# Patient Record
Sex: Female | Born: 1965 | Race: White | Hispanic: No | Marital: Married | Smoking: Never smoker
Health system: Southern US, Community
[De-identification: ages and names within clinical notes are randomized; demographics above are authoritative.]

## PROBLEM LIST (undated history)

## (undated) DIAGNOSIS — Z973 Presence of spectacles and contact lenses: Secondary | ICD-10-CM

## (undated) DIAGNOSIS — Z8709 Personal history of other diseases of the respiratory system: Secondary | ICD-10-CM

## (undated) DIAGNOSIS — I4891 Unspecified atrial fibrillation: Secondary | ICD-10-CM

## (undated) DIAGNOSIS — M79673 Pain in unspecified foot: Secondary | ICD-10-CM

## (undated) DIAGNOSIS — Z889 Allergy status to unspecified drugs, medicaments and biological substances status: Secondary | ICD-10-CM

## (undated) DIAGNOSIS — R05 Cough: Secondary | ICD-10-CM

## (undated) DIAGNOSIS — F32A Depression, unspecified: Secondary | ICD-10-CM

## (undated) DIAGNOSIS — Z9889 Other specified postprocedural states: Secondary | ICD-10-CM

## (undated) DIAGNOSIS — N959 Unspecified menopausal and perimenopausal disorder: Secondary | ICD-10-CM

## (undated) DIAGNOSIS — G4730 Sleep apnea, unspecified: Secondary | ICD-10-CM

## (undated) DIAGNOSIS — R5383 Other fatigue: Secondary | ICD-10-CM

## (undated) DIAGNOSIS — R5381 Other malaise: Secondary | ICD-10-CM

## (undated) DIAGNOSIS — Z8489 Family history of other specified conditions: Secondary | ICD-10-CM

## (undated) DIAGNOSIS — G4700 Insomnia, unspecified: Secondary | ICD-10-CM

## (undated) DIAGNOSIS — Z87440 Personal history of urinary (tract) infections: Secondary | ICD-10-CM

## (undated) DIAGNOSIS — R112 Nausea with vomiting, unspecified: Secondary | ICD-10-CM

## (undated) DIAGNOSIS — D519 Vitamin B12 deficiency anemia, unspecified: Secondary | ICD-10-CM

## (undated) DIAGNOSIS — K589 Irritable bowel syndrome without diarrhea: Secondary | ICD-10-CM

## (undated) DIAGNOSIS — R059 Cough, unspecified: Secondary | ICD-10-CM

## (undated) DIAGNOSIS — N200 Calculus of kidney: Secondary | ICD-10-CM

## (undated) DIAGNOSIS — F329 Major depressive disorder, single episode, unspecified: Secondary | ICD-10-CM

## (undated) DIAGNOSIS — E739 Lactose intolerance, unspecified: Secondary | ICD-10-CM

## (undated) DIAGNOSIS — G43909 Migraine, unspecified, not intractable, without status migrainosus: Secondary | ICD-10-CM

## (undated) HISTORY — DX: Vitamin B12 deficiency anemia, unspecified: D51.9

## (undated) HISTORY — PX: TONSILLECTOMY: SUR1361

## (undated) HISTORY — PX: EXPLORATORY LAPAROTOMY: SUR591

## (undated) HISTORY — DX: Calculus of kidney: N20.0

## (undated) HISTORY — DX: Personal history of other diseases of the respiratory system: Z87.09

## (undated) HISTORY — DX: Allergy status to unspecified drugs, medicaments and biological substances: Z88.9

## (undated) HISTORY — PX: ABDOMINAL HYSTERECTOMY: SHX81

## (undated) HISTORY — PX: APPENDECTOMY: SHX54

## (undated) HISTORY — DX: Lactose intolerance, unspecified: E73.9

## (undated) HISTORY — DX: Other fatigue: R53.83

## (undated) HISTORY — DX: Cough, unspecified: R05.9

## (undated) HISTORY — DX: Other malaise: R53.81

## (undated) HISTORY — DX: Cough: R05

## (undated) HISTORY — DX: Unspecified atrial fibrillation: I48.91

## (undated) HISTORY — DX: Irritable bowel syndrome, unspecified: K58.9

## (undated) HISTORY — PX: HAND SURGERY: SHX662

## (undated) HISTORY — DX: Personal history of urinary (tract) infections: Z87.440

## (undated) HISTORY — DX: Migraine, unspecified, not intractable, without status migrainosus: G43.909

## (undated) HISTORY — DX: Sleep apnea, unspecified: G47.30

## (undated) HISTORY — DX: Pain in unspecified foot: M79.673

## (undated) HISTORY — PX: BILATERAL CARPAL TUNNEL RELEASE: SHX6508

## (undated) HISTORY — DX: Insomnia, unspecified: G47.00

## (undated) HISTORY — PX: PARTIAL HYSTERECTOMY: SHX80

## (undated) HISTORY — PX: GASTRIC BYPASS: SHX52

## (undated) HISTORY — DX: Unspecified menopausal and perimenopausal disorder: N95.9

---

## 2005-05-19 ENCOUNTER — Ambulatory Visit: Payer: Self-pay

## 2005-09-10 ENCOUNTER — Other Ambulatory Visit: Payer: Self-pay

## 2005-09-10 ENCOUNTER — Inpatient Hospital Stay: Payer: Self-pay | Admitting: Internal Medicine

## 2005-09-11 ENCOUNTER — Other Ambulatory Visit: Payer: Self-pay

## 2006-01-03 ENCOUNTER — Ambulatory Visit: Payer: Self-pay | Admitting: Oncology

## 2006-01-05 LAB — CBC WITH DIFFERENTIAL (CANCER CENTER ONLY)
BASO#: 0.1 10*3/uL (ref 0.0–0.2)
BASO%: 0.8 % (ref 0.0–2.0)
HCT: 33.2 % — ABNORMAL LOW (ref 34.8–46.6)
HGB: 10.8 g/dL — ABNORMAL LOW (ref 11.6–15.9)
LYMPH#: 2.2 10*3/uL (ref 0.9–3.3)
LYMPH%: 29.7 % (ref 14.0–48.0)
MCHC: 32.6 g/dL (ref 32.0–36.0)
MCV: 76 fL — ABNORMAL LOW (ref 81–101)
MONO#: 0.4 10*3/uL (ref 0.1–0.9)
NEUT%: 59 % (ref 39.6–80.0)
RDW: 14.4 % (ref 10.5–14.6)
WBC: 7.5 10*3/uL (ref 3.9–10.0)

## 2006-01-05 LAB — COMPREHENSIVE METABOLIC PANEL
Albumin: 4.1 g/dL (ref 3.5–5.2)
Alkaline Phosphatase: 115 U/L (ref 39–117)
BUN: 9 mg/dL (ref 6–23)
Glucose, Bld: 82 mg/dL (ref 70–99)
Total Bilirubin: 0.4 mg/dL (ref 0.3–1.2)

## 2006-01-05 LAB — MORPHOLOGY - CHCC SATELLITE

## 2006-01-05 LAB — VITAMIN B12: Vitamin B-12: 250 pg/mL (ref 211–911)

## 2006-01-05 LAB — RETICULOCYTES (CHCC)
ABS Retic: 61.6 10*3/uL (ref 19.0–186.0)
RBC.: 4.4 MIL/uL (ref 3.87–5.11)

## 2006-01-05 LAB — IRON AND TIBC
%SAT: 8 % — ABNORMAL LOW (ref 20–55)
Iron: 37 ug/dL — ABNORMAL LOW (ref 42–145)

## 2006-01-05 LAB — FERRITIN: Ferritin: 10 ng/mL (ref 10–291)

## 2006-03-09 ENCOUNTER — Ambulatory Visit: Payer: Self-pay | Admitting: Oncology

## 2006-03-15 LAB — CBC WITH DIFFERENTIAL (CANCER CENTER ONLY)
EOS%: 3.3 % (ref 0.0–7.0)
MCH: 28.4 pg (ref 26.0–34.0)
MCHC: 33.7 g/dL (ref 32.0–36.0)
MONO%: 6.8 % (ref 0.0–13.0)
NEUT#: 2.7 10*3/uL (ref 1.5–6.5)
Platelets: 337 10*3/uL (ref 145–400)
RDW: 17.3 % — ABNORMAL HIGH (ref 10.5–14.6)

## 2006-03-15 LAB — FERRITIN: Ferritin: 156 ng/mL (ref 10–291)

## 2006-04-15 LAB — CBC WITH DIFFERENTIAL (CANCER CENTER ONLY)
BASO#: 0 10*3/uL (ref 0.0–0.2)
EOS%: 3.5 % (ref 0.0–7.0)
HCT: 40.9 % (ref 34.8–46.6)
HGB: 13.7 g/dL (ref 11.6–15.9)
MCH: 29.4 pg (ref 26.0–34.0)
MCHC: 33.6 g/dL (ref 32.0–36.0)
MONO%: 8.2 % (ref 0.0–13.0)
NEUT%: 55.6 % (ref 39.6–80.0)

## 2006-04-19 DIAGNOSIS — I4891 Unspecified atrial fibrillation: Secondary | ICD-10-CM

## 2006-04-19 HISTORY — DX: Unspecified atrial fibrillation: I48.91

## 2006-05-11 ENCOUNTER — Ambulatory Visit: Payer: Self-pay | Admitting: Oncology

## 2006-05-16 LAB — CBC WITH DIFFERENTIAL (CANCER CENTER ONLY)
BASO#: 0.1 10*3/uL (ref 0.0–0.2)
BASO%: 0.7 % (ref 0.0–2.0)
EOS%: 2.4 % (ref 0.0–7.0)
HCT: 38.5 % (ref 34.8–46.6)
HGB: 13.2 g/dL (ref 11.6–15.9)
LYMPH#: 1.8 10*3/uL (ref 0.9–3.3)
LYMPH%: 20.9 % (ref 14.0–48.0)
MCH: 30.4 pg (ref 26.0–34.0)
MCHC: 34.1 g/dL (ref 32.0–36.0)
MONO%: 6 % (ref 0.0–13.0)
NEUT%: 70 % (ref 39.6–80.0)
RDW: 11.4 % (ref 10.5–14.6)

## 2006-07-05 ENCOUNTER — Ambulatory Visit: Payer: Self-pay | Admitting: Oncology

## 2007-06-22 ENCOUNTER — Ambulatory Visit: Payer: Self-pay | Admitting: Family Medicine

## 2007-06-22 ENCOUNTER — Ambulatory Visit: Payer: Self-pay | Admitting: Oncology

## 2007-06-26 LAB — CBC WITH DIFFERENTIAL (CANCER CENTER ONLY)
BASO#: 0 10*3/uL (ref 0.0–0.2)
EOS%: 3 % (ref 0.0–7.0)
HCT: 36.2 % (ref 34.8–46.6)
HGB: 12.1 g/dL (ref 11.6–15.9)
LYMPH#: 1.7 10*3/uL (ref 0.9–3.3)
LYMPH%: 24.7 % (ref 14.0–48.0)
MCHC: 33.4 g/dL (ref 32.0–36.0)
MCV: 83 fL (ref 81–101)
NEUT%: 64.1 % (ref 39.6–80.0)

## 2007-06-26 LAB — BASIC METABOLIC PANEL - CANCER CENTER ONLY
CO2: 25 mEq/L (ref 18–33)
Calcium: 9.1 mg/dL (ref 8.0–10.3)
Creat: 0.7 mg/dl (ref 0.6–1.2)
Glucose, Bld: 91 mg/dL (ref 73–118)

## 2007-06-26 LAB — FERRITIN: Ferritin: 13 ng/mL (ref 10–291)

## 2007-06-26 LAB — IRON AND TIBC: Iron: 46 ug/dL (ref 42–145)

## 2007-08-09 ENCOUNTER — Ambulatory Visit: Payer: Self-pay | Admitting: Oncology

## 2007-09-26 ENCOUNTER — Ambulatory Visit: Payer: Self-pay | Admitting: Oncology

## 2007-11-16 ENCOUNTER — Ambulatory Visit: Payer: Self-pay | Admitting: Oncology

## 2007-11-21 LAB — VITAMIN B12: Vitamin B-12: 135 pg/mL — ABNORMAL LOW (ref 211–911)

## 2007-11-21 LAB — CBC WITH DIFFERENTIAL (CANCER CENTER ONLY)
BASO#: 0 10*3/uL (ref 0.0–0.2)
BASO%: 0.5 % (ref 0.0–2.0)
Eosinophils Absolute: 0.2 10*3/uL (ref 0.0–0.5)
HCT: 38.6 % (ref 34.8–46.6)
HGB: 13.2 g/dL (ref 11.6–15.9)
LYMPH#: 1.9 10*3/uL (ref 0.9–3.3)
MCV: 88 fL (ref 81–101)
MONO#: 0.4 10*3/uL (ref 0.1–0.9)
NEUT%: 60.4 % (ref 39.6–80.0)
RBC: 4.39 10*6/uL (ref 3.70–5.32)
RDW: 14 % (ref 10.5–14.6)
WBC: 6.4 10*3/uL (ref 3.9–10.0)

## 2007-11-21 LAB — BASIC METABOLIC PANEL - CANCER CENTER ONLY
BUN, Bld: 9 mg/dL (ref 7–22)
Creat: 0.6 mg/dl (ref 0.6–1.2)
Potassium: 3.6 mEq/L (ref 3.3–4.7)

## 2007-11-21 LAB — IRON AND TIBC: TIBC: 308 ug/dL (ref 250–470)

## 2008-03-25 ENCOUNTER — Ambulatory Visit: Payer: Self-pay | Admitting: Oncology

## 2008-04-02 LAB — CBC WITH DIFFERENTIAL (CANCER CENTER ONLY)
BASO%: 0.9 % (ref 0.0–2.0)
Eosinophils Absolute: 0.2 10*3/uL (ref 0.0–0.5)
LYMPH#: 2 10*3/uL (ref 0.9–3.3)
MONO#: 0.4 10*3/uL (ref 0.1–0.9)
NEUT#: 3.9 10*3/uL (ref 1.5–6.5)
Platelets: 356 10*3/uL (ref 145–400)
RBC: 4.36 10*6/uL (ref 3.70–5.32)
RDW: 12.1 % (ref 10.5–14.6)
WBC: 6.5 10*3/uL (ref 3.9–10.0)

## 2008-04-02 LAB — IRON AND TIBC
%SAT: 44 % (ref 20–55)
Iron: 141 ug/dL (ref 42–145)
TIBC: 318 ug/dL (ref 250–470)

## 2008-04-02 LAB — VITAMIN B12: Vitamin B-12: 205 pg/mL — ABNORMAL LOW (ref 211–911)

## 2008-08-19 ENCOUNTER — Ambulatory Visit: Payer: Self-pay | Admitting: Oncology

## 2009-05-19 ENCOUNTER — Ambulatory Visit: Payer: Self-pay | Admitting: Oncology

## 2009-05-21 LAB — IRON AND TIBC
%SAT: 20 % (ref 20–55)
Iron: 69 ug/dL (ref 42–145)
UIBC: 271 ug/dL

## 2009-05-21 LAB — CBC WITH DIFFERENTIAL (CANCER CENTER ONLY)
Eosinophils Absolute: 0.2 10*3/uL (ref 0.0–0.5)
MONO#: 0.4 10*3/uL (ref 0.1–0.9)
MONO%: 5.9 % (ref 0.0–13.0)
NEUT#: 4.8 10*3/uL (ref 1.5–6.5)
Platelets: 356 10*3/uL (ref 145–400)
RBC: 4.35 10*6/uL (ref 3.70–5.32)
WBC: 7.2 10*3/uL (ref 3.9–10.0)

## 2009-05-21 LAB — FERRITIN: Ferritin: 141 ng/mL (ref 10–291)

## 2010-06-09 ENCOUNTER — Ambulatory Visit: Payer: Self-pay | Admitting: Family Medicine

## 2010-06-15 ENCOUNTER — Ambulatory Visit: Payer: Self-pay | Admitting: Family Medicine

## 2010-07-28 ENCOUNTER — Ambulatory Visit: Payer: Self-pay | Admitting: Urology

## 2010-08-05 ENCOUNTER — Ambulatory Visit: Payer: Self-pay | Admitting: Urology

## 2010-11-13 ENCOUNTER — Encounter (INDEPENDENT_AMBULATORY_CARE_PROVIDER_SITE_OTHER): Payer: Self-pay | Admitting: Surgery

## 2013-11-01 ENCOUNTER — Ambulatory Visit: Payer: Self-pay | Admitting: Family Medicine

## 2014-02-17 DEATH — deceased

## 2014-09-21 ENCOUNTER — Ambulatory Visit
Admission: EM | Admit: 2014-09-21 | Discharge: 2014-09-21 | Disposition: A | Discharge status: Home / Self Care | Payer: Managed Care, Other (non HMO) | Attending: Family Medicine | Admitting: Family Medicine

## 2014-09-21 ENCOUNTER — Encounter: Payer: Self-pay | Admitting: Gynecology

## 2014-09-21 DIAGNOSIS — L259 Unspecified contact dermatitis, unspecified cause: Secondary | ICD-10-CM | POA: Diagnosis not present

## 2014-09-21 HISTORY — DX: Depression, unspecified: F32.A

## 2014-09-21 HISTORY — DX: Major depressive disorder, single episode, unspecified: F32.9

## 2014-09-21 MED ORDER — PREDNISONE 20 MG PO TABS: 60.0000 mg | ORAL_TABLET | Freq: Every day | ORAL | Status: DC

## 2014-09-21 MED ORDER — PREDNISONE 5 MG PO TABS: 10.0000 mg | ORAL_TABLET | Freq: Every day | ORAL | Status: DC

## 2014-09-21 NOTE — Discharge Instructions (Signed)

## 2014-09-21 NOTE — ED Notes (Signed)
Rash on arms, legs, chest and back times two days. Rash itch

## 2014-09-21 NOTE — ED Provider Notes (Signed)
CSN: [id number]     Arrival date & time 09/21/14  1531 History   First MD Initiated Contact with Patient 09/21/14 1615     Chief Complaint  Patient presents with  . Rash   (Consider location/radiation/quality/duration/timing/severity/associated sxs/prior Treatment) HPI Comments: Caucasian female noticed rash back of neck last night and anterior elbows before going to bed itchy took benadryl itching resolved but returned and rash spreading.  Denied change in lotions, creams, cleaning products, jobs, eating, recipes, job.  Friend visited her yesterday and she hugged her.  Otherwise feels well denied tongue/eye/throat swelling, nausea, vomiting, diarrhea, wheezing, dyspnea, sob, swelling hands/feet.  Patient is a 49 y.o. female presenting with rash. The history is provided by the patient.  Rash Location:  Head/neck, torso, shoulder/arm, leg and face Facial rash location:  L cheek and R cheek Shoulder/arm rash location:  L elbow, R elbow, L forearm, L upper arm, L arm, R forearm, R upper arm and R arm Torso rash location:  Upper back Leg rash location:  L lower leg and R lower leg Quality: itchiness and redness   Quality: not blistering, not bruising, not burning, not draining, not dry, not painful, not peeling, not scaling, not swelling and not weeping   Severity:  Moderate Onset quality:  Sudden Duration:  18 hours Timing:  Constant Progression:  Spreading Chronicity:  New Context: animal contact   Context: not chemical exposure, not diapers, not eggs, not exposure to similar rash, not food, not hot tub use, not insect bite/sting, not medications, not new detergent/soap, not nuts, not plant contact, not pollen, not pregnancy, not sick contacts and not sun exposure   Relieved by:  Antihistamines Worsened by:  Heat Ineffective treatments:  Antihistamines Associated symptoms: no abdominal pain, no diarrhea, no fatigue, no fever, no headaches, no hoarse voice, no induration, no joint pain, no  myalgias, no nausea, no periorbital edema, no shortness of breath, no sore throat, no throat swelling, no tongue swelling, no URI, not vomiting and not wheezing     Past Medical History  Diagnosis Date  . Depression    Past Surgical History  Procedure Laterality Date  . Appendectomy    . Abdominal surgery    . Hand surgery    . Tonsillectomy     Family History  Problem Relation Age of Onset  . Heart failure Father    History  Substance Use Topics  . Smoking status: Former Research scientist (life sciences)  . Smokeless tobacco: Never Used  . Alcohol Use: No   OB History    No data available     Review of Systems  Constitutional: Negative for fever, chills, diaphoresis, activity change, appetite change and fatigue.  HENT: Negative for congestion, dental problem, drooling, ear discharge, ear pain, facial swelling, hearing loss, hoarse voice, mouth sores, nosebleeds, postnasal drip, rhinorrhea, sinus pressure, sneezing, sore throat, tinnitus, trouble swallowing and voice change.   Eyes: Negative for photophobia, pain, discharge, redness and itching.  Respiratory: Negative for cough, choking, chest tightness, shortness of breath, wheezing and stridor.   Cardiovascular: Negative for chest pain, palpitations and leg swelling.  Gastrointestinal: Negative for nausea, vomiting, abdominal pain, diarrhea, constipation, blood in stool, abdominal distention and rectal pain.  Endocrine: Negative for cold intolerance and heat intolerance.  Genitourinary: Negative for hematuria and difficulty urinating.  Musculoskeletal: Negative for myalgias, back pain, joint swelling, arthralgias, gait problem, neck pain and neck stiffness.  Skin: Positive for rash. Negative for color change, pallor and wound.  Allergic/Immunologic: Negative for environmental allergies  and food allergies.  Neurological: Negative for dizziness, tremors, seizures, syncope, facial asymmetry, speech difficulty, weakness, light-headedness, numbness and  headaches.  Hematological: Negative for adenopathy. Does not bruise/bleed easily.  Psychiatric/Behavioral: Negative for behavioral problems, confusion, sleep disturbance and agitation.    Allergies  Codeine sulfate and Sulfur  Home Medications   Prior to Admission medications   Medication Sig Start Date End Date Taking? Authorizing Provider  FOLIC ACID PO Take by mouth.   Yes Historical Provider, MD  Progesterone Micronized (PROGESTERONE PO) Take by mouth.   Yes Historical Provider, MD  Venlafaxine HCl (EFFEXOR PO) Take by mouth.   Yes Historical Provider, MD  predniSONE (DELTASONE) 20 MG tablet Take 3 tablets (60 mg total) by mouth daily with breakfast. For 7 days then 1.5 tab (30mg ) po daily for 7 days then 1 tab po daily for 2 days 09/21/14   Olen Cordial, NP  predniSONE (DELTASONE) 5 MG tablet Take 2 tablets (10 mg total) by mouth daily with breakfast. For 2 days then 1 tab by mouth daily for 3 days 09/21/14   Olen Cordial, NP   BP 124/94 mmHg  Pulse 107  Temp(Src) 98.2 F (36.8 C) (Oral)  Ht 5' (1.524 m)  Wt 230 lb (104.327 kg)  BMI 44.92 kg/m2  SpO2 100% Physical Exam  Constitutional: She is oriented to person, place, and time. Vital signs are normal. She appears well-developed and well-nourished. No distress.  HENT:  Head: Normocephalic and atraumatic.  Right Ear: External ear normal.  Left Ear: External ear normal.  Nose: Nose normal.  Mouth/Throat: Oropharynx is clear and moist. No oropharyngeal exudate.  Eyes: Conjunctivae, EOM and lids are normal. Pupils are equal, round, and reactive to light. Right eye exhibits no discharge. Left eye exhibits no discharge. No scleral icterus.  Neck: Trachea normal and normal range of motion. Neck supple. No JVD present. No tracheal deviation present. No thyromegaly present.  Cardiovascular: Normal rate, regular rhythm, normal heart sounds and intact distal pulses.  Exam reveals no gallop and no friction rub.   No murmur  heard. Pulmonary/Chest: Effort normal and breath sounds normal. No respiratory distress. She has no wheezes. She has no rales. She exhibits no tenderness.  Abdominal: Soft. Bowel sounds are normal. She exhibits no distension and no mass. There is no tenderness. There is no rebound and no guarding.  Musculoskeletal: Normal range of motion. She exhibits no edema or tenderness.  Lymphadenopathy:    She has no cervical adenopathy.  Neurological: She is alert and oriented to person, place, and time. She displays normal reflexes. Coordination normal.  Skin: Skin is warm, dry and intact. Rash noted. No abrasion, no bruising, no burn, no ecchymosis, no laceration, no lesion, no petechiae and no purpura noted. Rash is macular, papular, maculopapular and urticarial. Rash is not nodular, not pustular and not vesicular. There is erythema. No cyanosis. No pallor. Nails show no clubbing.     Patient skin dry warm capillary refill less than 3 seconds sitting comfortable on gurney not restless or itching in front of me  Psychiatric: She has a normal mood and affect. Her speech is normal and behavior is normal. Judgment and thought content normal. Cognition and memory are normal.  Nursing note and vitals reviewed.   ED Course  Procedures (including critical care time) Labs Review Labs Reviewed - No data to display  Imaging Review No results found.  Patient with itching and arm discomfort as rash under BP cuff.  To follow up  with PCM for repeat blood pressure after oral steroids complete sooner if headaches, visual changes,  MDM   1. Contact dermatitis    Medication as directed.  Symptomatic therapy suggested.  Warm to cool water soaks and/or oatmeal baths.  Calamine lotion topical prn affected areas.  Prednisone taper 60mg  po daily 7 days, 30mg  po 7 days then 20mg  2 days 10mg  2 days and 5mg  3 days.  Call or return to clinic as needed if these symptoms worsen or fail to improve as anticipated.  Go to ER if  difficulty breathing, swallowing, dizziness or chest pain for immediate evaluation and treatment follow up with [facility] if ER visit required.   Exitcare handout on allergic uticaria given to patient.  Directed to follow up with [facility] for allergist/testing referral.  Patient/mother verbalized agreement and understanding of treatment plan and had no further questions at this time.   P2:  Avoidance and hand washing.    Olen Cordial, NP 09/21/14 1820

## 2015-01-23 ENCOUNTER — Other Ambulatory Visit: Payer: Self-pay | Admitting: Family Medicine

## 2015-01-23 DIAGNOSIS — Z1231 Encounter for screening mammogram for malignant neoplasm of breast: Secondary | ICD-10-CM

## 2015-01-28 ENCOUNTER — Ambulatory Visit: Payer: Managed Care, Other (non HMO)

## 2015-02-06 ENCOUNTER — Ambulatory Visit
Admission: RE | Admit: 2015-02-06 | Discharge: 2015-02-06 | Disposition: A | Discharge status: Home / Self Care | Payer: Managed Care, Other (non HMO) | Source: Ambulatory Visit | Attending: Family Medicine | Admitting: Family Medicine

## 2015-02-06 DIAGNOSIS — Z1231 Encounter for screening mammogram for malignant neoplasm of breast: Secondary | ICD-10-CM | POA: Diagnosis present

## 2015-02-12 ENCOUNTER — Other Ambulatory Visit: Payer: Self-pay | Admitting: Family Medicine

## 2015-02-12 DIAGNOSIS — R928 Other abnormal and inconclusive findings on diagnostic imaging of breast: Secondary | ICD-10-CM

## 2015-02-24 ENCOUNTER — Ambulatory Visit
Admission: RE | Admit: 2015-02-24 | Discharge: 2015-02-24 | Disposition: A | Discharge status: Home / Self Care | Payer: Managed Care, Other (non HMO) | Source: Ambulatory Visit | Attending: Family Medicine | Admitting: Family Medicine

## 2015-02-24 DIAGNOSIS — R928 Other abnormal and inconclusive findings on diagnostic imaging of breast: Secondary | ICD-10-CM | POA: Diagnosis present

## 2015-02-24 DIAGNOSIS — N6002 Solitary cyst of left breast: Secondary | ICD-10-CM | POA: Insufficient documentation

## 2015-02-24 DIAGNOSIS — N6001 Solitary cyst of right breast: Secondary | ICD-10-CM | POA: Insufficient documentation

## 2015-08-26 ENCOUNTER — Encounter: Payer: Self-pay | Admitting: *Deleted

## 2015-08-26 ENCOUNTER — Inpatient Hospital Stay: Payer: Managed Care, Other (non HMO)

## 2015-08-26 ENCOUNTER — Inpatient Hospital Stay: Payer: Managed Care, Other (non HMO) | Attending: Internal Medicine | Admitting: Internal Medicine

## 2015-08-26 DIAGNOSIS — D689 Coagulation defect, unspecified: Secondary | ICD-10-CM | POA: Insufficient documentation

## 2015-08-26 DIAGNOSIS — D519 Vitamin B12 deficiency anemia, unspecified: Secondary | ICD-10-CM | POA: Diagnosis not present

## 2015-08-26 DIAGNOSIS — D509 Iron deficiency anemia, unspecified: Secondary | ICD-10-CM | POA: Insufficient documentation

## 2015-08-26 DIAGNOSIS — Z9884 Bariatric surgery status: Secondary | ICD-10-CM | POA: Diagnosis not present

## 2015-08-26 DIAGNOSIS — E739 Lactose intolerance, unspecified: Secondary | ICD-10-CM | POA: Diagnosis not present

## 2015-08-26 DIAGNOSIS — Z79899 Other long term (current) drug therapy: Secondary | ICD-10-CM | POA: Diagnosis not present

## 2015-08-26 DIAGNOSIS — G4730 Sleep apnea, unspecified: Secondary | ICD-10-CM | POA: Diagnosis not present

## 2015-08-26 DIAGNOSIS — K909 Intestinal malabsorption, unspecified: Secondary | ICD-10-CM | POA: Insufficient documentation

## 2015-08-26 DIAGNOSIS — F329 Major depressive disorder, single episode, unspecified: Secondary | ICD-10-CM | POA: Diagnosis not present

## 2015-08-26 DIAGNOSIS — I4891 Unspecified atrial fibrillation: Secondary | ICD-10-CM | POA: Insufficient documentation

## 2015-08-26 DIAGNOSIS — D508 Other iron deficiency anemias: Secondary | ICD-10-CM

## 2015-08-26 DIAGNOSIS — K589 Irritable bowel syndrome without diarrhea: Secondary | ICD-10-CM | POA: Diagnosis not present

## 2015-08-26 DIAGNOSIS — D500 Iron deficiency anemia secondary to blood loss (chronic): Secondary | ICD-10-CM | POA: Insufficient documentation

## 2015-08-26 LAB — IRON AND TIBC
Iron: 15 ug/dL — ABNORMAL LOW (ref 28–170)
Saturation Ratios: 4 % — ABNORMAL LOW (ref 10.4–31.8)
TIBC: 421 ug/dL (ref 250–450)
UIBC: 406 ug/dL

## 2015-08-26 LAB — RETICULOCYTES
RBC.: 4.5 MIL/uL (ref 3.80–5.20)
RETIC COUNT ABSOLUTE: 76.5 10*3/uL (ref 19.0–183.0)
RETIC CT PCT: 1.7 % (ref 0.4–3.1)

## 2015-08-26 LAB — CBC WITH DIFFERENTIAL/PLATELET
BASOS ABS: 0.1 10*3/uL (ref 0–0.1)
Basophils Relative: 1 %
Eosinophils Absolute: 0.2 10*3/uL (ref 0–0.7)
Eosinophils Relative: 3 %
HEMATOCRIT: 32.6 % — AB (ref 35.0–47.0)
HEMOGLOBIN: 10.2 g/dL — AB (ref 12.0–16.0)
Lymphs Abs: 1.9 10*3/uL (ref 1.0–3.6)
MCH: 22.6 pg — ABNORMAL LOW (ref 26.0–34.0)
MCHC: 31.5 g/dL — ABNORMAL LOW (ref 32.0–36.0)
MCV: 71.8 fL — AB (ref 80.0–100.0)
MONO ABS: 0.7 10*3/uL (ref 0.2–0.9)
Monocytes Relative: 8 %
NEUTROS ABS: 5.6 10*3/uL (ref 1.4–6.5)
Neutrophils Relative %: 65 %
Platelets: 470 10*3/uL — ABNORMAL HIGH (ref 150–440)
RBC: 4.54 MIL/uL (ref 3.80–5.20)
RDW: 16.7 % — ABNORMAL HIGH (ref 11.5–14.5)
WBC: 8.5 10*3/uL (ref 3.6–11.0)

## 2015-08-26 LAB — LACTATE DEHYDROGENASE: LDH: 116 U/L (ref 98–192)

## 2015-08-26 LAB — FERRITIN: Ferritin: 9 ng/mL — ABNORMAL LOW (ref 11–307)

## 2015-08-26 NOTE — Progress Notes (Signed)
[facility] CONSULT NOTE  Patient Care Team: Lynnell Jude, MD as PCP - General (Family Medicine) Lynnell Jude, MD (Family Medicine)  CHIEF COMPLAINTS/PURPOSE OF CONSULTATION:   # IRON DEFICIENCY ANEMIA sec to malabsorption [no colo/EGD]  # Gastric Bypass [2007; [facility]]  HISTORY OF PRESENTING ILLNESS:  Beth Sparks 50 y.o.  female female patient with prior history of gastric bypass approximately 10 years ago; history of iron deficiency anemiahas been referred to [place] for further evaluation and recommendations for iron deficiency anemia.   Patient complains of extreme fatigue. No nausea no vomiting. No blood in stools black colored stools. No blood in urine. Patient had prior history of abdominal hysterectomy.  Patient is intolerant to oral iron because of headaches and abdominal discomfort and constipation. Patient received IV iron 67 years ago. She gets IM B12 injections on a monthly basis.  ROS: A complete 10 point review of system is done which is negative except mentioned above in history of present illness  MEDICAL HISTORY:  Past Medical History  Diagnosis Date  . Depression   . H/O cystitis   . H/O sinusitis   . H/O seasonal allergies   . B12 deficiency anemia   . Renal stone   . Cough   . Foot pain   . Insomnia   . Lactose intolerance   . Malaise and fatigue   . Menopausal disorder   . Sleep apnea   . A-fib ([place])   . IBS (irritable bowel syndrome)   . Migraines     SURGICAL HISTORY: Past Surgical History  Procedure Laterality Date  . Appendectomy    . Hand surgery    . Tonsillectomy    . Gastric bypass    . Exploratory laparotomy    . Bilateral carpal tunnel release    . Partial hysterectomy    . 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 Marital Status: Married    Spouse Name: N/A  . Number of Children: N/A  . Years of Education: N/A    Occupational History  . Not on file.   Social History Main Topics  . Smoking status: Never Smoker   . Smokeless tobacco: Never Used  . Alcohol Use: 2.4 oz/week    4 Standard drinks or equivalent per week  . Drug Use: No  . Sexual Activity: Yes   Other Topics Concern  . Not on file   Social History Narrative    FAMILY HISTORY:No family history of any colon malignancies. Family History  Problem Relation Age of Onset  . Heart failure Father   . Prostate cancer Maternal Grandmother 85    ALLERGIES:  is allergic to tape; codeine sulfate; lac bovis; quinolones; and sulfur.  MEDICATIONS:  Current Outpatient Prescriptions  Medication Sig Dispense Refill  . estradiol (VIVELLE-DOT) 0.1 MG/24HR patch Place 1 patch onto the skin once a week.    Marland Kitchen FOLIC ACID PO Take 1 mg by mouth daily.     . Progesterone Micronized (PROGESTERONE PO) Take 2.5 mg by mouth daily.     . SUMAtriptan (IMITREX) 100 MG tablet Take 1 tablet by mouth daily as needed. migarines    . Venlafaxine HCl (EFFEXOR PO) Take 300 mg by mouth daily.      No current facility-administered medications for this visit.      Marland Kitchen  PHYSICAL EXAMINATION: ECOG PERFORMANCE STATUS: 0  Filed  Vitals:   08/26/15 0913 08/26/15 0915  BP:  163/91  Pulse:  63  Temp: 98.6 F (37 C)    Filed Weights   08/26/15 0913  Weight: 282 lb 1.3 oz (127.95 kg)    GENERAL: Well-nourished well-developed; Alert, no distress and comfortable.   Obese.  EYES: no pallor or icterus OROPHARYNX: no thrush or ulceration; good dentition  NECK: supple, no masses felt LYMPH:  no palpable lymphadenopathy in the cervical, axillary or inguinal regions LUNGS: clear to auscultation and  No wheeze or crackles HEART/CVS: regular rate & rhythm and no murmurs; No lower extremity edema ABDOMEN: abdomen soft, non-tender and normal bowel sounds Musculoskeletal:no cyanosis of digits and no clubbing  PSYCH: alert & oriented x 3 with fluent speech NEURO: no  focal motor/sensory deficits SKIN:  no rashes or significant lesions  LABORATORY DATA:  I have reviewed the data as listed Lab Results  Component Value Date   WBC 7.2 05/21/2009   HGB 13.3 05/21/2009   HCT 38.8 05/21/2009   MCV 89 05/21/2009   PLT 356 05/21/2009   No results for input(s): NA, K, CL, CO2, GLUCOSE, BUN, CREATININE, CALCIUM, GFRNONAA, GFRAA, PROT, ALBUMIN, AST, ALT, ALKPHOS, BILITOT, BILIDIR, IBILI in the last 8760 hours.  RADIOGRAPHIC STUDIES: I have personally reviewed the radiological images as listed and agreed with the findings in the report. No results found.  ASSESSMENT & PLAN:   # Iron deficient anemia- symptomatic from malabsorption/history of gastric bypass.By mouth intolerance. Recommend checking CBC/LDH iron studies ferritin. Recommend IV Feraheme 2. Discussed the potential infusion reactions.  # Recommend checking CBC in 2 months; also follow-up in 4 months/CBC and iron studies ferritin/possible IV Feraheme at that time.  All questions were answered. The patient knows to call the clinic with any problems, questions or concerns.  Thank you Dr. Clemmie Krill for allowing me to participate in the care of your pleasant patient. Please do not hesitate to contact me with questions or concerns in the interim.  # 30 minutes face-to-face with the patient discussing the above plan of care; more than 50% of time spent on counseling and coordination.     Cammie Sickle, MD 08/26/2015 9:36 AM

## 2015-09-02 ENCOUNTER — Inpatient Hospital Stay: Payer: Managed Care, Other (non HMO)

## 2015-09-02 VITALS — BP 128/80 | HR 68 | Temp 98.1°F | Resp 20

## 2015-09-02 DIAGNOSIS — D509 Iron deficiency anemia, unspecified: Secondary | ICD-10-CM | POA: Diagnosis not present

## 2015-09-02 DIAGNOSIS — D508 Other iron deficiency anemias: Secondary | ICD-10-CM

## 2015-09-02 MED ORDER — SODIUM CHLORIDE 0.9 % IV SOLN
Freq: Once | INTRAVENOUS | Status: AC
  Administered 2015-09-02: 11:00:00 via INTRAVENOUS
  Filled 2015-09-02: qty 1000

## 2015-09-02 MED ORDER — SODIUM CHLORIDE 0.9 % IV SOLN
510.0000 mg | Freq: Once | INTRAVENOUS | Status: AC
  Administered 2015-09-02: 510 mg via INTRAVENOUS
  Filled 2015-09-02: qty 17

## 2015-09-09 ENCOUNTER — Inpatient Hospital Stay: Payer: Managed Care, Other (non HMO)

## 2015-09-09 VITALS — BP 138/93 | HR 87 | Temp 97.6°F | Resp 18

## 2015-09-09 DIAGNOSIS — D509 Iron deficiency anemia, unspecified: Secondary | ICD-10-CM | POA: Diagnosis not present

## 2015-09-09 DIAGNOSIS — D508 Other iron deficiency anemias: Secondary | ICD-10-CM

## 2015-09-09 MED ORDER — SODIUM CHLORIDE 0.9 % IV SOLN
Freq: Once | INTRAVENOUS | Status: AC
  Administered 2015-09-09: 11:00:00 via INTRAVENOUS
  Filled 2015-09-09: qty 1000

## 2015-09-09 MED ORDER — SODIUM CHLORIDE 0.9 % IV SOLN
510.0000 mg | Freq: Once | INTRAVENOUS | Status: AC
  Administered 2015-09-09: 510 mg via INTRAVENOUS
  Filled 2015-09-09: qty 17

## 2015-10-28 ENCOUNTER — Inpatient Hospital Stay: Payer: Managed Care, Other (non HMO)

## 2015-11-04 ENCOUNTER — Inpatient Hospital Stay: Payer: Managed Care, Other (non HMO) | Attending: Internal Medicine

## 2015-11-04 DIAGNOSIS — Z79899 Other long term (current) drug therapy: Secondary | ICD-10-CM | POA: Diagnosis not present

## 2015-11-04 DIAGNOSIS — D508 Other iron deficiency anemias: Secondary | ICD-10-CM

## 2015-11-04 DIAGNOSIS — D509 Iron deficiency anemia, unspecified: Secondary | ICD-10-CM | POA: Insufficient documentation

## 2015-11-04 LAB — CBC WITH DIFFERENTIAL/PLATELET
BASOS ABS: 0.1 10*3/uL (ref 0–0.1)
Basophils Relative: 1 %
Eosinophils Absolute: 0.2 10*3/uL (ref 0–0.7)
Eosinophils Relative: 2 %
HEMATOCRIT: 39.9 % (ref 35.0–47.0)
HEMOGLOBIN: 13.1 g/dL (ref 12.0–16.0)
LYMPHS ABS: 2.1 10*3/uL (ref 1.0–3.6)
LYMPHS PCT: 23 %
MCH: 26.7 pg (ref 26.0–34.0)
MCHC: 32.8 g/dL (ref 32.0–36.0)
MCV: 81.4 fL (ref 80.0–100.0)
Monocytes Absolute: 0.7 10*3/uL (ref 0.2–0.9)
Monocytes Relative: 8 %
NEUTROS ABS: 6.1 10*3/uL (ref 1.4–6.5)
Neutrophils Relative %: 66 %
PLATELETS: 381 10*3/uL (ref 150–440)
RBC: 4.9 MIL/uL (ref 3.80–5.20)
RDW: 21.9 % — ABNORMAL HIGH (ref 11.5–14.5)
WBC: 9.3 10*3/uL (ref 3.6–11.0)

## 2015-11-05 ENCOUNTER — Telehealth: Payer: Self-pay | Admitting: *Deleted

## 2015-11-05 NOTE — Telephone Encounter (Signed)
Called patient to inform her that her hgb is improving.  MD has no new recommendations at this time,  Follow up as planned.  Verbalized understanding.

## 2015-11-05 NOTE — Telephone Encounter (Signed)
-----   Message from Cammie Sickle, MD sent at 11/04/2015  5:11 PM EDT ----- Please inform pt that hb- improved; plan follow as planned. No new recommendations at this time. Dr.B

## 2015-12-16 ENCOUNTER — Inpatient Hospital Stay: Payer: Managed Care, Other (non HMO) | Attending: Internal Medicine

## 2015-12-16 DIAGNOSIS — D509 Iron deficiency anemia, unspecified: Secondary | ICD-10-CM | POA: Diagnosis not present

## 2015-12-16 DIAGNOSIS — Z79899 Other long term (current) drug therapy: Secondary | ICD-10-CM | POA: Insufficient documentation

## 2015-12-16 DIAGNOSIS — D508 Other iron deficiency anemias: Secondary | ICD-10-CM

## 2015-12-16 LAB — IRON AND TIBC
IRON: 42 ug/dL (ref 28–170)
Saturation Ratios: 12 % (ref 10.4–31.8)
TIBC: 353 ug/dL (ref 250–450)
UIBC: 311 ug/dL

## 2015-12-16 LAB — CBC WITH DIFFERENTIAL/PLATELET
BASOS PCT: 1 %
Basophils Absolute: 0.1 10*3/uL (ref 0–0.1)
EOS ABS: 0.2 10*3/uL (ref 0–0.7)
Eosinophils Relative: 2 %
HEMATOCRIT: 40.2 % (ref 35.0–47.0)
Hemoglobin: 13.3 g/dL (ref 12.0–16.0)
Lymphocytes Relative: 23 %
Lymphs Abs: 1.9 10*3/uL (ref 1.0–3.6)
MCH: 27.8 pg (ref 26.0–34.0)
MCHC: 33.1 g/dL (ref 32.0–36.0)
MCV: 84.1 fL (ref 80.0–100.0)
MONO ABS: 0.6 10*3/uL (ref 0.2–0.9)
MONOS PCT: 7 %
Neutro Abs: 5.4 10*3/uL (ref 1.4–6.5)
Neutrophils Relative %: 67 %
Platelets: 418 10*3/uL (ref 150–440)
RBC: 4.78 MIL/uL (ref 3.80–5.20)
RDW: 14.6 % — AB (ref 11.5–14.5)
WBC: 8.1 10*3/uL (ref 3.6–11.0)

## 2015-12-16 LAB — FERRITIN: FERRITIN: 43 ng/mL (ref 11–307)

## 2015-12-23 ENCOUNTER — Inpatient Hospital Stay: Payer: Managed Care, Other (non HMO)

## 2015-12-23 ENCOUNTER — Inpatient Hospital Stay: Payer: Managed Care, Other (non HMO) | Admitting: Internal Medicine

## 2016-01-06 ENCOUNTER — Inpatient Hospital Stay: Payer: Managed Care, Other (non HMO) | Admitting: Internal Medicine

## 2016-01-06 ENCOUNTER — Inpatient Hospital Stay: Payer: Managed Care, Other (non HMO)

## 2016-01-13 ENCOUNTER — Inpatient Hospital Stay: Payer: Managed Care, Other (non HMO) | Attending: Internal Medicine | Admitting: Internal Medicine

## 2016-01-13 ENCOUNTER — Inpatient Hospital Stay: Payer: Managed Care, Other (non HMO)

## 2016-01-13 VITALS — BP 157/90 | HR 82 | Resp 18

## 2016-01-13 VITALS — BP 159/95 | HR 84 | Temp 98.5°F | Resp 18 | Ht 60.0 in | Wt 289.9 lb

## 2016-01-13 DIAGNOSIS — K909 Intestinal malabsorption, unspecified: Secondary | ICD-10-CM | POA: Diagnosis not present

## 2016-01-13 DIAGNOSIS — D509 Iron deficiency anemia, unspecified: Secondary | ICD-10-CM | POA: Insufficient documentation

## 2016-01-13 DIAGNOSIS — Z9884 Bariatric surgery status: Secondary | ICD-10-CM | POA: Diagnosis not present

## 2016-01-13 DIAGNOSIS — E739 Lactose intolerance, unspecified: Secondary | ICD-10-CM | POA: Insufficient documentation

## 2016-01-13 DIAGNOSIS — Z79899 Other long term (current) drug therapy: Secondary | ICD-10-CM | POA: Diagnosis not present

## 2016-01-13 DIAGNOSIS — I4891 Unspecified atrial fibrillation: Secondary | ICD-10-CM | POA: Insufficient documentation

## 2016-01-13 DIAGNOSIS — K589 Irritable bowel syndrome without diarrhea: Secondary | ICD-10-CM | POA: Diagnosis not present

## 2016-01-13 DIAGNOSIS — D508 Other iron deficiency anemias: Secondary | ICD-10-CM

## 2016-01-13 DIAGNOSIS — G4730 Sleep apnea, unspecified: Secondary | ICD-10-CM | POA: Diagnosis not present

## 2016-01-13 MED ORDER — SODIUM CHLORIDE 0.9 % IV SOLN
Freq: Once | INTRAVENOUS | Status: AC
  Administered 2016-01-13: 14:00:00 via INTRAVENOUS
  Filled 2016-01-13: qty 1000

## 2016-01-13 MED ORDER — FERUMOXYTOL INJECTION 510 MG/17 ML
510.0000 mg | Freq: Once | INTRAVENOUS | Status: AC
  Administered 2016-01-13: 510 mg via INTRAVENOUS
  Filled 2016-01-13: qty 17

## 2016-01-13 NOTE — Assessment & Plan Note (Addendum)
#   Iron deficient anemia- symptomatic from malabsorption/history of gastric bypass.By mouth intolerance.   # Status post IV iron significant improvement noted in the hemoglobin up to 13.; Saturation however is about 12%. Recommend IV iron again today.   # Recommend follow-up in 6 months IV iron possible Feraheme- labs few days prior.

## 2016-01-13 NOTE — Progress Notes (Signed)
[place] NOTE  Patient Care Team: Lynnell Jude, MD as PCP - General (Family Medicine) Lynnell Jude, MD (Family Medicine)  CHIEF COMPLAINTS/PURPOSE OF CONSULTATION:   # IRON DEFICIENCY ANEMIA sec to malabsorption [no colo/EGD] s/p IV ferrehem.  # Gastric Bypass [id number]; [place] [facility]]  HISTORY OF PRESENTING ILLNESS:  Beth Sparks 50 y.o.  female female patient with prior history of gastric bypass; and iron deficiency anemia is status post IV iron approximately 3 months ago.  Patient noted significant improvement of her energy levels post IV iron infusion. She gets IM B12 injections on a monthly basis.  ROS: A complete 10 point review of system is done which is negative except mentioned above in history of present illness  MEDICAL HISTORY:  Past Medical History:  Diagnosis Date  . A-fib ([place])   . B12 deficiency anemia   . Cough   . Depression   . Foot pain   . H/O cystitis   . H/O seasonal allergies   . H/O sinusitis   . IBS (irritable bowel syndrome)   . Insomnia   . Lactose intolerance   . Malaise and fatigue   . Menopausal disorder   . Migraines   . Renal stone   . Sleep apnea     SURGICAL HISTORY: Past Surgical History:  Procedure Laterality Date  . APPENDECTOMY    . BILATERAL CARPAL TUNNEL RELEASE    . EXPLORATORY LAPAROTOMY    . GASTRIC BYPASS    . HAND SURGERY    . PARTIAL HYSTERECTOMY    . TONSILLECTOMY    . 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 Marital status: Married    Spouse name: N/A  . Number of children: N/A  . Years of education: N/A   Occupational History  . Not on file.   Social History Main Topics  . Smoking status: Never Smoker  . Smokeless tobacco: Never Used  . Alcohol use 2.4 oz/week    4 Standard drinks or equivalent per week  . Drug use: No  . Sexual activity: Yes   Other Topics Concern  . Not on file    Social History Narrative  . No narrative on file    FAMILY HISTORY:No family history of any colon malignancies. Family History  Problem Relation Age of Onset  . Heart failure Father   . Prostate cancer Maternal Grandmother 85    ALLERGIES:  is allergic to tape; codeine sulfate; lac bovis; quinolones; and sulfur.  MEDICATIONS:  Current Outpatient Prescriptions  Medication Sig Dispense Refill  . estradiol (VIVELLE-DOT) 0.1 MG/24HR patch Place 1 patch onto the skin once a week.    Marland Kitchen FOLIC ACID PO Take 1 mg by mouth daily.     . Progesterone Micronized (PROGESTERONE PO) Take 2.5 mg by mouth daily.     . SUMAtriptan (IMITREX) 100 MG tablet Take 1 tablet by mouth daily as needed. migarines    . Venlafaxine HCl (EFFEXOR PO) Take 300 mg by mouth daily.      No current facility-administered medications for this visit.       Marland Kitchen  PHYSICAL EXAMINATION: ECOG PERFORMANCE STATUS: 0  Vitals:   01/13/16 1354  BP: (!) 159/95  Pulse: 84  Resp: 18  Temp: 98.5 F (36.9 C)   Filed Weights   01/13/16 1354  Weight: 289 lb 14.5 oz (131.5 kg)  GENERAL: Well-nourished well-developed; Alert, no distress and comfortable.   Obese.  EYES: no pallor or icterus OROPHARYNX: no thrush or ulceration; good dentition  NECK: supple, no masses felt LYMPH:  no palpable lymphadenopathy in the cervical, axillary or inguinal regions LUNGS: clear to auscultation and  No wheeze or crackles HEART/CVS: regular rate & rhythm and no murmurs; No lower extremity edema ABDOMEN: abdomen soft, non-tender and normal bowel sounds Musculoskeletal:no cyanosis of digits and no clubbing  PSYCH: alert & oriented x 3 with fluent speech NEURO: no focal motor/sensory deficits SKIN:  no rashes or significant lesions  LABORATORY DATA:  I have reviewed the data as listed Lab Results  Component Value Date   WBC 8.1 12/16/2015   HGB 13.3 12/16/2015   HCT 40.2 12/16/2015   MCV 84.1 12/16/2015   PLT 418 12/16/2015    No results for input(s): NA, K, CL, CO2, GLUCOSE, BUN, CREATININE, CALCIUM, GFRNONAA, GFRAA, PROT, ALBUMIN, AST, ALT, ALKPHOS, BILITOT, BILIDIR, IBILI in the last 8760 hours.  RADIOGRAPHIC STUDIES: I have personally reviewed the radiological images as listed and agreed with the findings in the report. No results found.  ASSESSMENT & PLAN:   Other iron deficiency anemias # Iron deficient anemia- symptomatic from malabsorption/history of gastric bypass.By mouth intolerance.   # Status post IV iron significant improvement noted in the hemoglobin up to 13.; Saturation however is about 12%. Recommend IV iron again today.   # Recommend follow-up in 6 months IV iron possible Feraheme- labs few days prior.       Cammie Sickle, MD 01/13/2016 5:04 PM

## 2016-03-10 ENCOUNTER — Other Ambulatory Visit: Payer: Self-pay | Admitting: Family Medicine

## 2016-03-10 DIAGNOSIS — Z1231 Encounter for screening mammogram for malignant neoplasm of breast: Secondary | ICD-10-CM

## 2016-03-25 ENCOUNTER — Ambulatory Visit
Admission: RE | Admit: 2016-03-25 | Discharge: 2016-03-25 | Disposition: A | Discharge status: Home / Self Care | Payer: Managed Care, Other (non HMO) | Source: Ambulatory Visit | Attending: Family Medicine | Admitting: Family Medicine

## 2016-03-25 DIAGNOSIS — Z1231 Encounter for screening mammogram for malignant neoplasm of breast: Secondary | ICD-10-CM

## 2016-06-14 ENCOUNTER — Telehealth: Payer: Self-pay

## 2016-06-14 ENCOUNTER — Other Ambulatory Visit: Payer: Self-pay

## 2016-06-14 DIAGNOSIS — Z1211 Encounter for screening for malignant neoplasm of colon: Secondary | ICD-10-CM

## 2016-06-14 NOTE — Telephone Encounter (Signed)
Gastroenterology Pre-Procedure Review  Request Date: 06/24/2016 Requesting Physician: Dr. Allen Norris  PATIENT REVIEW QUESTIONS: The patient responded to the following health history questions as indicated:    1. Are you having any GI issues? no 2. Do you have a personal history of Polyps? no 3. Do you have a family history of Colon Cancer or Polyps? no 4. Diabetes Mellitus? no 5. Joint replacements in the past 12 months?no 6. Major health problems in the past 3 months?no 7. Any artificial heart valves, MVP, or defibrillator?no    MEDICATIONS & ALLERGIES:    Patient reports the following regarding taking any anticoagulation/antiplatelet therapy:   Plavix, Coumadin, Eliquis, Xarelto, Lovenox, Pradaxa, Brilinta, or Effient? no Aspirin? no  Patient confirms/reports the following medications:  Current Outpatient Prescriptions  Medication Sig Dispense Refill  . estradiol (VIVELLE-DOT) 0.1 MG/24HR patch Place 1 patch onto the skin once a week.    Marland Kitchen FOLIC ACID PO Take 1 mg by mouth daily.     . Progesterone Micronized (PROGESTERONE PO) Take 2.5 mg by mouth daily.     . SUMAtriptan (IMITREX) 100 MG tablet Take 1 tablet by mouth daily as needed. migarines    . Venlafaxine HCl (EFFEXOR PO) Take 300 mg by mouth daily.      No current facility-administered medications for this visit.     Patient confirms/reports the following allergies:  Allergies  Allergen Reactions  . Tape Rash  . Codeine Sulfate Nausea And Vomiting  . Lac Bovis Other (See Comments)  . Quinolones Other (See Comments)    cipro gives migranes  . Sulfur Rash    No orders of the defined types were placed in this encounter.   AUTHORIZATION INFORMATION Primary Insurance: 1D#: Group #:  Secondary Insurance: 1D#: Group #:  SCHEDULE INFORMATION: Date: 06/24/16 Time: Location: [place]

## 2016-06-21 ENCOUNTER — Encounter: Payer: Self-pay | Admitting: *Deleted

## 2016-06-21 ENCOUNTER — Other Ambulatory Visit: Payer: Self-pay

## 2016-06-21 DIAGNOSIS — Z1211 Encounter for screening for malignant neoplasm of colon: Secondary | ICD-10-CM

## 2016-06-24 ENCOUNTER — Ambulatory Visit: Payer: Managed Care, Other (non HMO) | Admitting: Anesthesiology

## 2016-06-24 ENCOUNTER — Ambulatory Visit: Admit: 2016-06-24 | Payer: Managed Care, Other (non HMO) | Admitting: Gastroenterology

## 2016-06-24 ENCOUNTER — Encounter: Payer: Self-pay | Admitting: Anesthesiology

## 2016-06-24 ENCOUNTER — Encounter
Admission: RE | Disposition: A | Discharge status: Home / Self Care | Payer: Self-pay | Source: Ambulatory Visit | Attending: Gastroenterology

## 2016-06-24 ENCOUNTER — Ambulatory Visit
Admission: RE | Admit: 2016-06-24 | Discharge: 2016-06-24 | Disposition: A | Discharge status: Home / Self Care | Payer: Managed Care, Other (non HMO) | Source: Ambulatory Visit | Attending: Gastroenterology | Admitting: Gastroenterology

## 2016-06-24 DIAGNOSIS — K589 Irritable bowel syndrome without diarrhea: Secondary | ICD-10-CM | POA: Diagnosis not present

## 2016-06-24 DIAGNOSIS — G4730 Sleep apnea, unspecified: Secondary | ICD-10-CM | POA: Insufficient documentation

## 2016-06-24 DIAGNOSIS — Z6841 Body Mass Index (BMI) 40.0 and over, adult: Secondary | ICD-10-CM | POA: Insufficient documentation

## 2016-06-24 DIAGNOSIS — E669 Obesity, unspecified: Secondary | ICD-10-CM | POA: Insufficient documentation

## 2016-06-24 DIAGNOSIS — D519 Vitamin B12 deficiency anemia, unspecified: Secondary | ICD-10-CM | POA: Diagnosis not present

## 2016-06-24 DIAGNOSIS — N289 Disorder of kidney and ureter, unspecified: Secondary | ICD-10-CM | POA: Diagnosis not present

## 2016-06-24 DIAGNOSIS — E739 Lactose intolerance, unspecified: Secondary | ICD-10-CM | POA: Diagnosis not present

## 2016-06-24 DIAGNOSIS — K5730 Diverticulosis of large intestine without perforation or abscess without bleeding: Secondary | ICD-10-CM | POA: Diagnosis not present

## 2016-06-24 DIAGNOSIS — D649 Anemia, unspecified: Secondary | ICD-10-CM | POA: Insufficient documentation

## 2016-06-24 DIAGNOSIS — D123 Benign neoplasm of transverse colon: Secondary | ICD-10-CM

## 2016-06-24 DIAGNOSIS — R51 Headache: Secondary | ICD-10-CM | POA: Diagnosis not present

## 2016-06-24 DIAGNOSIS — Z9884 Bariatric surgery status: Secondary | ICD-10-CM | POA: Diagnosis not present

## 2016-06-24 DIAGNOSIS — G4700 Insomnia, unspecified: Secondary | ICD-10-CM | POA: Diagnosis not present

## 2016-06-24 DIAGNOSIS — F329 Major depressive disorder, single episode, unspecified: Secondary | ICD-10-CM | POA: Diagnosis not present

## 2016-06-24 DIAGNOSIS — Z1211 Encounter for screening for malignant neoplasm of colon: Secondary | ICD-10-CM | POA: Insufficient documentation

## 2016-06-24 HISTORY — DX: Presence of spectacles and contact lenses: Z97.3

## 2016-06-24 HISTORY — DX: Other specified postprocedural states: Z98.890

## 2016-06-24 HISTORY — DX: Family history of other specified conditions: Z84.89

## 2016-06-24 HISTORY — PX: COLONOSCOPY WITH PROPOFOL: SHX5780

## 2016-06-24 HISTORY — DX: Nausea with vomiting, unspecified: R11.2

## 2016-06-24 SURGERY — COLONOSCOPY WITH PROPOFOL
Anesthesia: Choice

## 2016-06-24 SURGERY — COLONOSCOPY WITH PROPOFOL
Anesthesia: General

## 2016-06-24 MED ORDER — PROPOFOL 10 MG/ML IV BOLUS
INTRAVENOUS | Status: AC
  Filled 2016-06-24: qty 20

## 2016-06-24 MED ORDER — LIDOCAINE HCL (PF) 1 % IJ SOLN
2.0000 mL | Freq: Once | INTRAMUSCULAR | Status: AC
  Administered 2016-06-24: 0.3 mL via INTRADERMAL

## 2016-06-24 MED ORDER — PROPOFOL 500 MG/50ML IV EMUL
INTRAVENOUS | Status: DC | PRN
  Administered 2016-06-24: 100 ug/kg/min via INTRAVENOUS

## 2016-06-24 MED ORDER — SODIUM CHLORIDE 0.9 % IV SOLN
INTRAVENOUS | Status: DC
  Administered 2016-06-24: 1000 mL via INTRAVENOUS

## 2016-06-24 MED ORDER — LIDOCAINE HCL (PF) 1 % IJ SOLN
INTRAMUSCULAR | Status: AC
  Administered 2016-06-24: 0.3 mL via INTRADERMAL
  Filled 2016-06-24: qty 2

## 2016-06-24 MED ORDER — PROPOFOL 500 MG/50ML IV EMUL
INTRAVENOUS | Status: AC
  Filled 2016-06-24: qty 50

## 2016-06-24 NOTE — Anesthesia Preprocedure Evaluation (Signed)
Anesthesia Evaluation  Patient identified by MRN, date of birth, ID band Patient awake    Reviewed: Allergy & Precautions, NPO status , Patient's Chart, lab work & pertinent test results, reviewed documented beta blocker date and time   History of Anesthesia Complications (+) PONV and Family history of anesthesia reaction  Airway Mallampati: III  TM Distance: >3 FB     Dental  (+) Chipped   Pulmonary sleep apnea ,           Cardiovascular      Neuro/Psych  Headaches, PSYCHIATRIC DISORDERS Depression    GI/Hepatic   Endo/Other  Morbid obesity  Renal/GU Renal disease     Musculoskeletal   Abdominal   Peds  Hematology  (+) anemia ,   Anesthesia Other Findings   Reproductive/Obstetrics                             Anesthesia Physical Anesthesia Plan  ASA: III  Anesthesia Plan: General   Post-op Pain Management:    Induction: Intravenous  Airway Management Planned:   Additional Equipment:   Intra-op Plan:   Post-operative Plan:   Informed Consent: I have reviewed the patients History and Physical, chart, labs and discussed the procedure including the risks, benefits and alternatives for the proposed anesthesia with the patient or authorized representative who has indicated his/her understanding and acceptance.     Plan Discussed with: CRNA  Anesthesia Plan Comments:         Anesthesia Quick Evaluation

## 2016-06-24 NOTE — H&P (Signed)
Jonathon Bellows MD [address], [place], [place] [address] Phone: [phone] Fax : [phone]  Primary Care Physician:  Lynnell Jude, MD Primary Gastroenterologist:  Dr. Jonathon Bellows   Pre-Procedure History & Physical: HPI:  Beth Sparks is a 51 y.o. female is here for an colonoscopy.   Past Medical History:  Diagnosis Date  . A-fib ([place]) 2008   after gastric bypass,  resolved  . B12 deficiency anemia   . Cough   . Depression   . Family history of adverse reaction to anesthesia    Mother - PONV  . Foot pain   . H/O cystitis   . H/O seasonal allergies   . H/O sinusitis   . IBS (irritable bowel syndrome)   . Insomnia   . Lactose intolerance   . Malaise and fatigue   . Menopausal disorder   . Migraines    1 week, monthly  . PONV (postoperative nausea and vomiting)   . Renal stone   . Sleep apnea    no cpap  . Wears contact lenses     Past Surgical History:  Procedure Laterality Date  . ABDOMINAL HYSTERECTOMY    . APPENDECTOMY    . BILATERAL CARPAL TUNNEL RELEASE    . EXPLORATORY LAPAROTOMY    . GASTRIC BYPASS    . HAND SURGERY    . PARTIAL HYSTERECTOMY    . TONSILLECTOMY    . TONSILLECTOMY      Prior to Admission medications   Medication Sig Start Date End Date Taking? Authorizing Provider  B Complex Vitamins (VITAMIN B COMPLEX PO) Take by mouth daily.    Historical Provider, MD  BIOTIN PO Take by mouth.    Historical Provider, MD  buPROPion (WELLBUTRIN XL) 150 MG 24 hr tablet Take by mouth. 09/11/08   Historical Provider, MD  cyclobenzaprine (FLEXERIL) 10 MG tablet Take by mouth. 09/11/08   Historical Provider, MD  estradiol (VIVELLE-DOT) 0.1 MG/24HR patch Place 1 patch onto the skin once a week.    Historical Provider, MD  FOLIC ACID PO Take 1 mg by mouth daily.     Historical Provider, MD  MAGNESIUM PO Take by mouth daily.    Historical Provider, MD  Multiple Vitamin (MULTIVITAMIN) tablet Take 1 tablet by mouth daily.    Historical Provider, MD    SUMAtriptan (IMITREX) 100 MG tablet Take 1 tablet by mouth daily as needed. migarines 10/24/08   Historical Provider, MD  valACYclovir (VALTREX) 500 MG tablet Take by mouth. 03/22/08   Historical Provider, MD  Venlafaxine HCl (EFFEXOR PO) Take 300 mg by mouth daily.     Historical Provider, MD    Allergies as of 06/21/2016 - Review Complete 06/21/2016  Allergen Reaction Noted  . Sulfa antibiotics    . Tape Rash 08/26/2015  . Codeine sulfate Nausea And Vomiting 09/21/2014  . Lac bovis Other (See Comments) 08/26/2015  . Quinolones Other (See Comments) 08/26/2015  . Sulfur Rash 09/21/2014    Family History  Problem Relation Age of Onset  . Heart failure Father   . Prostate cancer Maternal Grandfather 57  . Breast cancer Neg Hx     Social History   Social History  . Marital status: Married    Spouse name: N/A  . Number of children: N/A  . Years of education: N/A   Occupational History  . Not on file.   Social History Main Topics  . Smoking status: Never Smoker  . Smokeless tobacco: Never Used  .  Alcohol use 2.4 oz/week    4 Standard drinks or equivalent per week  . Drug use: No  . Sexual activity: Yes   Other Topics Concern  . Not on file   Social History Narrative  . No narrative on file    Review of Systems: See HPI, otherwise negative ROS  Physical Exam: BP (!) 148/81   Pulse 95   Temp 98.5 F (36.9 C) (Tympanic)   Resp 17   Ht 5' (1.524 m)   Wt 268 lb (121.6 kg)   SpO2 97%   BMI 52.34 kg/m  General:   Alert,  pleasant and cooperative in NAD Head:  Normocephalic and atraumatic. Neck:  Supple; no masses or thyromegaly. Lungs:  Clear throughout to auscultation.    Heart:  Regular rate and rhythm. Abdomen:  Soft, nontender and nondistended. Normal bowel sounds, without guarding, and without rebound.   Neurologic:  Alert and  oriented x4;  grossly normal neurologically.  Impression/Plan: [name] is here for an colonoscopy to be performed for  Screening colonoscopy average risk    Risks, benefits, limitations, and alternatives regarding  colonoscopy have been reviewed with the patient.  Questions have been answered.  All parties agreeable.   Jonathon Bellows, MD  06/24/2016, 8:28 AM

## 2016-06-24 NOTE — Op Note (Signed)
[facility] Patient Name: Beth Sparks Procedure Date: 06/24/2016 8:34 AM MRN: [id number] Account #: [id number] Date of Birth: December 12, 1965 Admit Type: Outpatient Age: 51 Room: [facility] ENDO ROOM 4 Gender: Female Note Status: Finalized Procedure:            Colonoscopy Indications:          Screening for colorectal malignant neoplasm Providers:            Jonathon Bellows MD, MD Referring MD:         Lynnell Jude (Referring MD) Medicines:            Monitored Anesthesia Care Complications:        No immediate complications. Procedure:            Pre-Anesthesia Assessment:                       - Prior to the procedure, a History and Physical was                        performed, and patient medications, allergies and                        sensitivities were reviewed. The patient's tolerance of                        previous anesthesia was reviewed.                       - The risks and benefits of the procedure and the                        sedation options and risks were discussed with the                        patient. All questions were answered and informed                        consent was obtained.                       - ASA Grade Assessment: II - A patient with mild                        systemic disease.                       After obtaining informed consent, the colonoscope was                        passed under direct vision. Throughout the procedure,                        the patient's blood pressure, pulse, and oxygen                        saturations were monitored continuously. The Olympus                        CF-HQ190L Colonoscope (S#. [id number]) was introduced  through the anus and advanced to the the cecum,                        identified by the appendiceal orifice, IC valve and                        transillumination. The colonoscopy was performed with                        ease. The patient tolerated the  procedure well. The                        quality of the bowel preparation was excellent. Findings:      The perianal and digital rectal examinations were normal.      A few small-mouthed diverticula were found in the entire colon.      A 10 mm polyp was found in the transverse colon. The polyp was       semi-pedunculated. The polyp was removed with a hot snare. Resection and       retrieval were complete.      The exam was otherwise without abnormality on direct and retroflexion       views. Impression:           - Diverticulosis in the entire examined colon.                       - One 10 mm polyp in the transverse colon, removed with                        a hot snare. Resected and retrieved.                       - The examination was otherwise normal on direct and                        retroflexion views. Recommendation:       - Discharge patient to home (with escort).                       - Resume previous diet.                       - Continue present medications.                       - Await pathology results.                       - Repeat colonoscopy for surveillance based on                        pathology results. Procedure Code(s):    --- Professional ---                       [phone], Colonoscopy, flexible; with removal of tumor(s),                        polyp(s), or other lesion(s) by snare technique Diagnosis Code(s):    --- Professional ---  Z12.11, Encounter for screening for malignant neoplasm                        of colon                       D12.3, Benign neoplasm of transverse colon (hepatic                        flexure or splenic flexure)                       K57.30, Diverticulosis of large intestine without                        perforation or abscess without bleeding CPT copyright 2016 American Medical Association. All rights reserved. The codes documented in this report are preliminary and upon coder review may  be revised to  meet current compliance requirements. Jonathon Bellows, MD Jonathon Bellows MD, MD 06/24/2016 9:12:30 AM This report has been signed electronically. Number of Addenda: 0 Note Initiated On: 06/24/2016 8:34 AM Scope Withdrawal Time: 0 hours 17 minutes 45 seconds  Total Procedure Duration: 0 hours 23 minutes 37 seconds       [facility]

## 2016-06-24 NOTE — Anesthesia Post-op Follow-up Note (Cosign Needed)
Anesthesia QCDR form completed.        

## 2016-06-24 NOTE — Transfer of Care (Signed)
Immediate Anesthesia Transfer of Care Note  Patient: Beth Sparks  Procedure(s) Performed: Procedure(s): COLONOSCOPY WITH PROPOFOL (N/A)  Patient Location: PACU  Anesthesia Type:General  Level of Consciousness: awake, alert  and oriented  Airway & Oxygen Therapy: Patient Spontanous Breathing and Patient connected to nasal cannula oxygen  Post-op Assessment: Report given to RN and Post -op Vital signs reviewed and stable  Post vital signs: Reviewed and stable  Last Vitals:  Vitals:   06/24/16 0805  BP: (!) 148/81  Pulse: 95  Resp: 17  Temp: 36.9 C    Last Pain:  Vitals:   06/24/16 0805  TempSrc: Tympanic         Complications: No apparent anesthesia complications

## 2016-06-24 NOTE — Anesthesia Postprocedure Evaluation (Signed)
Anesthesia Post Note  Patient: Beth Sparks  Procedure(s) Performed: Procedure(s) (LRB): COLONOSCOPY WITH PROPOFOL (N/A)  Patient location during evaluation: Endoscopy Anesthesia Type: General Level of consciousness: awake and alert Pain management: pain level controlled Vital Signs Assessment: post-procedure vital signs reviewed and stable Respiratory status: spontaneous breathing, nonlabored ventilation, respiratory function stable and patient connected to nasal cannula oxygen Cardiovascular status: blood pressure returned to baseline and stable Postop Assessment: no signs of nausea or vomiting Anesthetic complications: no     Last Vitals:  Vitals:   06/24/16 0940 06/24/16 0950  BP: (!) 135/91 127/87  Pulse:    Resp:    Temp:      Last Pain:  Vitals:   06/24/16 0922  TempSrc: Tympanic                 Felisia Balcom S

## 2016-06-25 ENCOUNTER — Encounter: Payer: Self-pay | Admitting: Gastroenterology

## 2016-06-25 LAB — SURGICAL PATHOLOGY

## 2016-06-29 ENCOUNTER — Other Ambulatory Visit: Payer: Self-pay | Admitting: Internal Medicine

## 2016-06-29 ENCOUNTER — Inpatient Hospital Stay: Payer: Managed Care, Other (non HMO) | Attending: Internal Medicine

## 2016-06-29 DIAGNOSIS — E739 Lactose intolerance, unspecified: Secondary | ICD-10-CM | POA: Diagnosis not present

## 2016-06-29 DIAGNOSIS — K589 Irritable bowel syndrome without diarrhea: Secondary | ICD-10-CM | POA: Insufficient documentation

## 2016-06-29 DIAGNOSIS — Z87442 Personal history of urinary calculi: Secondary | ICD-10-CM | POA: Insufficient documentation

## 2016-06-29 DIAGNOSIS — D509 Iron deficiency anemia, unspecified: Secondary | ICD-10-CM | POA: Diagnosis not present

## 2016-06-29 DIAGNOSIS — G4730 Sleep apnea, unspecified: Secondary | ICD-10-CM | POA: Insufficient documentation

## 2016-06-29 DIAGNOSIS — Z9884 Bariatric surgery status: Secondary | ICD-10-CM | POA: Diagnosis not present

## 2016-06-29 DIAGNOSIS — D519 Vitamin B12 deficiency anemia, unspecified: Secondary | ICD-10-CM | POA: Diagnosis not present

## 2016-06-29 DIAGNOSIS — K909 Intestinal malabsorption, unspecified: Secondary | ICD-10-CM | POA: Diagnosis not present

## 2016-06-29 DIAGNOSIS — Z79899 Other long term (current) drug therapy: Secondary | ICD-10-CM | POA: Insufficient documentation

## 2016-06-29 DIAGNOSIS — F329 Major depressive disorder, single episode, unspecified: Secondary | ICD-10-CM | POA: Diagnosis not present

## 2016-06-29 DIAGNOSIS — D508 Other iron deficiency anemias: Secondary | ICD-10-CM

## 2016-06-29 DIAGNOSIS — G4700 Insomnia, unspecified: Secondary | ICD-10-CM | POA: Insufficient documentation

## 2016-06-29 LAB — CBC WITH DIFFERENTIAL/PLATELET
BASOS ABS: 0.1 10*3/uL (ref 0–0.1)
BASOS PCT: 1 %
EOS ABS: 0.1 10*3/uL (ref 0–0.7)
EOS PCT: 2 %
HCT: 44.2 % (ref 35.0–47.0)
Hemoglobin: 14.6 g/dL (ref 12.0–16.0)
Lymphocytes Relative: 24 %
Lymphs Abs: 1.9 10*3/uL (ref 1.0–3.6)
MCH: 29.1 pg (ref 26.0–34.0)
MCHC: 33 g/dL (ref 32.0–36.0)
MCV: 88.1 fL (ref 80.0–100.0)
Monocytes Absolute: 0.7 10*3/uL (ref 0.2–0.9)
Monocytes Relative: 8 %
Neutro Abs: 5.2 10*3/uL (ref 1.4–6.5)
Neutrophils Relative %: 65 %
PLATELETS: 425 10*3/uL (ref 150–440)
RBC: 5.02 MIL/uL (ref 3.80–5.20)
RDW: 14.6 % — ABNORMAL HIGH (ref 11.5–14.5)
WBC: 8 10*3/uL (ref 3.6–11.0)

## 2016-06-29 LAB — FERRITIN: FERRITIN: 121 ng/mL (ref 11–307)

## 2016-06-29 LAB — IRON AND TIBC
Iron: 51 ug/dL (ref 28–170)
SATURATION RATIOS: 18 % (ref 10.4–31.8)
TIBC: 283 ug/dL (ref 250–450)
UIBC: 232 ug/dL

## 2016-07-06 ENCOUNTER — Inpatient Hospital Stay: Payer: Managed Care, Other (non HMO)

## 2016-07-06 ENCOUNTER — Inpatient Hospital Stay: Payer: Managed Care, Other (non HMO) | Admitting: Internal Medicine

## 2016-07-06 ENCOUNTER — Telehealth: Payer: Self-pay | Admitting: *Deleted

## 2016-07-06 NOTE — Telephone Encounter (Signed)
Patient cnl today's apt today states that she "doesn't feel good and would like to r/s her md/IV iron apt."  Apt will be r/s

## 2016-07-13 ENCOUNTER — Inpatient Hospital Stay: Payer: Managed Care, Other (non HMO)

## 2016-07-13 ENCOUNTER — Inpatient Hospital Stay (HOSPITAL_BASED_OUTPATIENT_CLINIC_OR_DEPARTMENT_OTHER): Payer: Managed Care, Other (non HMO) | Admitting: Internal Medicine

## 2016-07-13 VITALS — BP 144/93 | HR 87 | Temp 97.5°F | Resp 18 | Wt 269.2 lb

## 2016-07-13 DIAGNOSIS — D509 Iron deficiency anemia, unspecified: Secondary | ICD-10-CM | POA: Diagnosis not present

## 2016-07-13 DIAGNOSIS — K909 Intestinal malabsorption, unspecified: Secondary | ICD-10-CM | POA: Diagnosis not present

## 2016-07-13 DIAGNOSIS — Z79899 Other long term (current) drug therapy: Secondary | ICD-10-CM | POA: Diagnosis not present

## 2016-07-13 DIAGNOSIS — Z9884 Bariatric surgery status: Secondary | ICD-10-CM | POA: Diagnosis not present

## 2016-07-13 DIAGNOSIS — D500 Iron deficiency anemia secondary to blood loss (chronic): Secondary | ICD-10-CM

## 2016-07-13 NOTE — Progress Notes (Signed)
FOLOW UP APPT -STATED SHE FEELS GREAT.

## 2016-07-13 NOTE — Assessment & Plan Note (Addendum)
#   Iron deficient anemia- symptomatic from malabsorption/history of gastric bypass.By mouth intolerance.  # Today hemoglobin is 14. Saturation 18% ferritin 141. Patient feeling well. Would not recommend IV iron.  # Recommend follow-up in 6 months IV iron possible Feraheme- labs few days prior.

## 2016-07-13 NOTE — Progress Notes (Signed)
[facility] NOTE  Patient Care Team: Lynnell Jude, MD as PCP - General (Family Medicine) Lynnell Jude, MD (Family Medicine)  CHIEF COMPLAINTS/PURPOSE OF CONSULTATION:   # IRON DEFICIENCY ANEMIA sec to malabsorption/ gastric by pass] s/p IV ferrehem. Colo- march 2018- small polyp [Dr.Anna]  # Gastric Bypass [2007; [facility]];  IM B12 injections on a monthly basis.  HISTORY OF PRESENTING ILLNESS:  Beth Sparks 51 y.o.  female female patient with prior history of gastric bypass; and iron deficiency anemia is status post IV iron [fall 2017]- is here for follow-up.  Recently patient had a colonoscopy; small polyp noted. No blood in stools or black stools. Energy levels are adequate.  ROS: A complete 10 point review of system is done which is negative except mentioned above in history of present illness  MEDICAL HISTORY:  Past Medical History:  Diagnosis Date  . A-fib ([place]) 2008   after gastric bypass,  resolved  . B12 deficiency anemia   . Cough   . Depression   . Family history of adverse reaction to anesthesia    Mother - PONV  . Foot pain   . H/O cystitis   . H/O seasonal allergies   . H/O sinusitis   . IBS (irritable bowel syndrome)   . Insomnia   . Lactose intolerance   . Malaise and fatigue   . Menopausal disorder   . Migraines    1 week, monthly  . PONV (postoperative nausea and vomiting)   . Renal stone   . Sleep apnea    no cpap  . Wears contact lenses     SURGICAL HISTORY: Past Surgical History:  Procedure Laterality Date  . ABDOMINAL HYSTERECTOMY    . APPENDECTOMY    . BILATERAL CARPAL TUNNEL RELEASE    . COLONOSCOPY WITH PROPOFOL N/A 06/24/2016   Procedure: COLONOSCOPY WITH PROPOFOL;  Surgeon: Jonathon Bellows, MD;  Location: [facility] ENDOSCOPY;  Service: Endoscopy;  Laterality: N/A;  . EXPLORATORY LAPAROTOMY    . GASTRIC BYPASS    . HAND SURGERY    . PARTIAL HYSTERECTOMY    . TONSILLECTOMY    . 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 Marital status: Married    Spouse name: N/A  . Number of children: N/A  . Years of education: N/A   Occupational History  . Not on file.   Social History Main Topics  . Smoking status: Never Smoker  . Smokeless tobacco: Never Used  . Alcohol use 2.4 oz/week    4 Standard drinks or equivalent per week  . Drug use: No  . Sexual activity: Yes   Other Topics Concern  . Not on file   Social History Narrative  . No narrative on file    FAMILY HISTORY:No family history of any colon malignancies. Family History  Problem Relation Age of Onset  . Heart failure Father   . Prostate cancer Maternal Grandfather 27  . Breast cancer Neg Hx     ALLERGIES:  is allergic to sulfa antibiotics; tape; codeine sulfate; lac bovis; quinolones; and sulfur.  MEDICATIONS:  Current Outpatient Prescriptions  Medication Sig Dispense Refill  . B Complex Vitamins (VITAMIN B COMPLEX PO) Take by mouth daily.    Marland Kitchen BIOTIN PO Take by mouth.    . estradiol (VIVELLE-DOT) 0.1 MG/24HR patch Place 1 patch onto the skin once a week.    Marland Kitchen  FOLIC ACID PO Take 1 mg by mouth daily.     Marland Kitchen MAGNESIUM PO Take by mouth daily.    . Multiple Vitamin (MULTIVITAMIN) tablet Take 1 tablet by mouth daily.    . SUMAtriptan (IMITREX) 100 MG tablet Take 1 tablet by mouth daily as needed. migarines    . Venlafaxine HCl (EFFEXOR PO) Take 300 mg by mouth daily.      No current facility-administered medications for this visit.       Marland Kitchen  PHYSICAL EXAMINATION: ECOG PERFORMANCE STATUS: 0  Vitals:   07/13/16 1335  BP: (!) 144/93  Pulse: 87  Resp: 18  Temp: 97.5 F (36.4 C)   Filed Weights   07/13/16 1335  Weight: 269 lb 2.9 oz (122.1 kg)    GENERAL: Well-nourished well-developed; Alert, no distress and comfortable.   Obese.  EYES: no pallor or icterus OROPHARYNX: no thrush or ulceration; good dentition  NECK:  supple, no masses felt LYMPH:  no palpable lymphadenopathy in the cervical, axillary or inguinal regions LUNGS: clear to auscultation and  No wheeze or crackles HEART/CVS: regular rate & rhythm and no murmurs; No lower extremity edema ABDOMEN: abdomen soft, non-tender and normal bowel sounds Musculoskeletal:no cyanosis of digits and no clubbing  PSYCH: alert & oriented x 3 with fluent speech NEURO: no focal motor/sensory deficits SKIN:  no rashes or significant lesions  LABORATORY DATA:  I have reviewed the data as listed Lab Results  Component Value Date   WBC 8.0 06/29/2016   HGB 14.6 06/29/2016   HCT 44.2 06/29/2016   MCV 88.1 06/29/2016   PLT 425 06/29/2016   No results for input(s): NA, K, CL, CO2, GLUCOSE, BUN, CREATININE, CALCIUM, GFRNONAA, GFRAA, PROT, ALBUMIN, AST, ALT, ALKPHOS, BILITOT, BILIDIR, IBILI in the last 8760 hours.  RADIOGRAPHIC STUDIES: I have personally reviewed the radiological images as listed and agreed with the findings in the report. No results found.  ASSESSMENT & PLAN:   Iron deficiency anemia due to chronic blood loss # Iron deficient anemia- symptomatic from malabsorption/history of gastric bypass.By mouth intolerance.  # Today hemoglobin is 14. Saturation 18% ferritin 141. Patient feeling well. Would not recommend IV iron.  # Recommend follow-up in 6 months IV iron possible Feraheme- labs few days prior.     Cammie Sickle, MD 07/26/2016 7:45 AM

## 2017-01-06 ENCOUNTER — Inpatient Hospital Stay: Payer: Managed Care, Other (non HMO) | Attending: Internal Medicine

## 2017-01-06 DIAGNOSIS — K588 Other irritable bowel syndrome: Secondary | ICD-10-CM | POA: Insufficient documentation

## 2017-01-06 DIAGNOSIS — D500 Iron deficiency anemia secondary to blood loss (chronic): Secondary | ICD-10-CM

## 2017-01-06 DIAGNOSIS — G4730 Sleep apnea, unspecified: Secondary | ICD-10-CM | POA: Insufficient documentation

## 2017-01-06 DIAGNOSIS — Z9884 Bariatric surgery status: Secondary | ICD-10-CM | POA: Insufficient documentation

## 2017-01-06 DIAGNOSIS — E739 Lactose intolerance, unspecified: Secondary | ICD-10-CM | POA: Diagnosis not present

## 2017-01-06 DIAGNOSIS — D509 Iron deficiency anemia, unspecified: Secondary | ICD-10-CM | POA: Diagnosis present

## 2017-01-06 DIAGNOSIS — G4700 Insomnia, unspecified: Secondary | ICD-10-CM | POA: Diagnosis not present

## 2017-01-06 DIAGNOSIS — E538 Deficiency of other specified B group vitamins: Secondary | ICD-10-CM | POA: Diagnosis not present

## 2017-01-06 DIAGNOSIS — Z79899 Other long term (current) drug therapy: Secondary | ICD-10-CM | POA: Diagnosis not present

## 2017-01-06 LAB — CBC WITH DIFFERENTIAL/PLATELET
Basophils Absolute: 0.1 10*3/uL (ref 0–0.1)
Basophils Relative: 1 %
Eosinophils Absolute: 0.5 10*3/uL (ref 0–0.7)
Eosinophils Relative: 5 %
HEMATOCRIT: 42.4 % (ref 35.0–47.0)
HEMOGLOBIN: 14 g/dL (ref 12.0–16.0)
LYMPHS ABS: 2.5 10*3/uL (ref 1.0–3.6)
LYMPHS PCT: 29 %
MCH: 29 pg (ref 26.0–34.0)
MCHC: 33 g/dL (ref 32.0–36.0)
MCV: 87.9 fL (ref 80.0–100.0)
MONOS PCT: 7 %
Monocytes Absolute: 0.6 10*3/uL (ref 0.2–0.9)
NEUTROS ABS: 5 10*3/uL (ref 1.4–6.5)
Neutrophils Relative %: 58 %
Platelets: 404 10*3/uL (ref 150–440)
RBC: 4.83 MIL/uL (ref 3.80–5.20)
RDW: 14.1 % (ref 11.5–14.5)
WBC: 8.7 10*3/uL (ref 3.6–11.0)

## 2017-01-06 LAB — IRON AND TIBC
Iron: 54 ug/dL (ref 28–170)
SATURATION RATIOS: 16 % (ref 10.4–31.8)
TIBC: 339 ug/dL (ref 250–450)
UIBC: 285 ug/dL

## 2017-01-06 LAB — FERRITIN: FERRITIN: 64 ng/mL (ref 11–307)

## 2017-01-10 ENCOUNTER — Other Ambulatory Visit: Payer: Self-pay | Admitting: Internal Medicine

## 2017-01-10 DIAGNOSIS — D500 Iron deficiency anemia secondary to blood loss (chronic): Secondary | ICD-10-CM

## 2017-01-11 ENCOUNTER — Inpatient Hospital Stay: Payer: Managed Care, Other (non HMO)

## 2017-01-11 ENCOUNTER — Inpatient Hospital Stay (HOSPITAL_BASED_OUTPATIENT_CLINIC_OR_DEPARTMENT_OTHER): Payer: Managed Care, Other (non HMO) | Admitting: Internal Medicine

## 2017-01-11 VITALS — BP 151/82 | HR 91 | Temp 98.1°F | Resp 16 | Wt 251.3 lb

## 2017-01-11 DIAGNOSIS — Z9884 Bariatric surgery status: Secondary | ICD-10-CM

## 2017-01-11 DIAGNOSIS — E538 Deficiency of other specified B group vitamins: Secondary | ICD-10-CM

## 2017-01-11 DIAGNOSIS — D509 Iron deficiency anemia, unspecified: Secondary | ICD-10-CM

## 2017-01-11 DIAGNOSIS — Z79899 Other long term (current) drug therapy: Secondary | ICD-10-CM | POA: Diagnosis not present

## 2017-01-11 DIAGNOSIS — D500 Iron deficiency anemia secondary to blood loss (chronic): Secondary | ICD-10-CM

## 2017-01-11 NOTE — Progress Notes (Signed)
Patient is here today for a follow up. Patient states no new concerns today.  

## 2017-01-11 NOTE — Progress Notes (Signed)
[facility] CONSULT NOTE  Patient Care Team: Lynnell Jude, MD as PCP - General (Family Medicine) Clemmie Krill Lynnell Jude, MD (Family Medicine)  CHIEF COMPLAINTS/PURPOSE OF CONSULTATION:   # IRON DEFICIENCY ANEMIA sec to malabsorption/ gastric by pass] s/p IV ferrehem. Colo- march 2018- small polyp [Dr.Anna]  # Gastric Bypass [2007; [facility]];  IM B12 injections on a monthly basis.  HISTORY OF PRESENTING ILLNESS:  Beth Sparks 51 y.o.  female female patient with prior history of gastric bypass; and iron deficiency anemia is status post IV iron [fall 2017]- is here for follow-up.  No blood in stools or black stools. Energy levels are adequate. Not iron pills.  ROS: A complete 10 point review of system is done which is negative except mentioned above in history of present illness  MEDICAL HISTORY:  Past Medical History:  Diagnosis Date  . A-fib ([place]) 2008   after gastric bypass,  resolved  . B12 deficiency anemia   . Cough   . Depression   . Family history of adverse reaction to anesthesia    Mother - PONV  . Foot pain   . H/O cystitis   . H/O seasonal allergies   . H/O sinusitis   . IBS (irritable bowel syndrome)   . Insomnia   . Lactose intolerance   . Malaise and fatigue   . Menopausal disorder   . Migraines    1 week, monthly  . PONV (postoperative nausea and vomiting)   . Renal stone   . Sleep apnea    no cpap  . Wears contact lenses     SURGICAL HISTORY: Past Surgical History:  Procedure Laterality Date  . ABDOMINAL HYSTERECTOMY    . APPENDECTOMY    . BILATERAL CARPAL TUNNEL RELEASE    . COLONOSCOPY WITH PROPOFOL N/A 06/24/2016   Procedure: COLONOSCOPY WITH PROPOFOL;  Surgeon: Jonathon Bellows, MD;  Location: [facility] ENDOSCOPY;  Service: Endoscopy;  Laterality: N/A;  . EXPLORATORY LAPAROTOMY    . GASTRIC BYPASS    . HAND SURGERY    . PARTIAL HYSTERECTOMY    . TONSILLECTOMY    . 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 Marital status: Married    Spouse name: N/A  . Number of children: N/A  . Years of education: N/A   Occupational History  . Not on file.   Social History Main Topics  . Smoking status: Never Smoker  . Smokeless tobacco: Never Used  . Alcohol use 2.4 oz/week    4 Standard drinks or equivalent per week  . Drug use: No  . Sexual activity: Yes   Other Topics Concern  . Not on file   Social History Narrative  . No narrative on file    FAMILY HISTORY:No family history of any colon malignancies. Family History  Problem Relation Age of Onset  . Heart failure Father   . Prostate cancer Maternal Grandfather 80  . Breast cancer Neg Hx     ALLERGIES:  is allergic to sulfa antibiotics; tape; codeine sulfate; lac bovis; quinolones; and sulfur.  MEDICATIONS:  Current Outpatient Prescriptions  Medication Sig Dispense Refill  . B Complex Vitamins (VITAMIN B COMPLEX PO) Take by mouth daily.    Marland Kitchen BIOTIN PO Take by mouth.    . estradiol (VIVELLE-DOT) 0.1 MG/24HR patch Place 1 patch onto the skin once a week.    Marland Kitchen FOLIC  ACID PO Take 1 mg by mouth daily.     Marland Kitchen MAGNESIUM PO Take by mouth daily.    . Multiple Vitamin (MULTIVITAMIN) tablet Take 1 tablet by mouth daily.    . SUMAtriptan (IMITREX) 100 MG tablet Take 1 tablet by mouth daily as needed. migarines    . Venlafaxine HCl (EFFEXOR PO) Take 300 mg by mouth daily.      No current facility-administered medications for this visit.       Marland Kitchen  PHYSICAL EXAMINATION: ECOG PERFORMANCE STATUS: 0  Vitals:   01/11/17 1422  BP: (!) 151/82  Pulse: 91  Resp: 16  Temp: 98.1 F (36.7 C)   Filed Weights   01/11/17 1422  Weight: 251 lb 5.2 oz (114 kg)    GENERAL: Well-nourished well-developed; Alert, no distress and comfortable.   Obese.  EYES: no pallor or icterus OROPHARYNX: no thrush or ulceration; good dentition  NECK: supple, no masses felt LYMPH:  no palpable  lymphadenopathy in the cervical, axillary or inguinal regions LUNGS: clear to auscultation and  No wheeze or crackles HEART/CVS: regular rate & rhythm and no murmurs; No lower extremity edema ABDOMEN: abdomen soft, non-tender and normal bowel sounds Musculoskeletal:no cyanosis of digits and no clubbing  PSYCH: alert & oriented x 3 with fluent speech NEURO: no focal motor/sensory deficits SKIN:  no rashes or significant lesions  LABORATORY DATA:  Results for Beth Sparks, Beth Sparks (MRN [id number]) as of 01/11/2017 22:52  Ref. Range 03/25/2016 12:17 06/24/2016 09:02 06/24/2016 09:12 06/29/2016 10:51 01/06/2017 11:30  Iron Latest Ref Range: 28 - 170 ug/dL    51 54  UIBC Latest Units: ug/dL    232 285  TIBC Latest Ref Range: 250 - 450 ug/dL    283 339  Saturation Ratios Latest Ref Range: 10.4 - 31.8 %    18 16  Ferritin Latest Ref Range: 11 - 307 ng/mL    121 64    I have reviewed the data as listed Lab Results  Component Value Date   WBC 8.7 01/06/2017   HGB 14.0 01/06/2017   HCT 42.4 01/06/2017   MCV 87.9 01/06/2017   PLT 404 01/06/2017   No results for input(s): NA, K, CL, CO2, GLUCOSE, BUN, CREATININE, CALCIUM, GFRNONAA, GFRAA, PROT, ALBUMIN, AST, ALT, ALKPHOS, BILITOT, BILIDIR, IBILI in the last 8760 hours.  RADIOGRAPHIC STUDIES: I have personally reviewed the radiological images as listed and agreed with the findings in the report. No results found.  ASSESSMENT & PLAN:   Iron deficiency anemia due to chronic blood loss # Iron deficient anemia- symptomatic from malabsorption/history of gastric bypass/ ? Blood loss. By mouth intolerance.  # Today hemoglobin is 14. Saturation 16% ferritin 64%. Patient feeling well. Would not recommend IV iron. [Pt not keen]  # Recommend follow-up in 6 months IV iron possible Feraheme- labs few days prior.     Cammie Sickle, MD 01/11/2017 10:55 PM

## 2017-01-11 NOTE — Assessment & Plan Note (Addendum)
#   Iron deficient anemia- symptomatic from malabsorption/history of gastric bypass/ ? Blood loss. By mouth intolerance.  # Today hemoglobin is 14. Saturation 16% ferritin 64%. Patient feeling well. Would not recommend IV iron. [Pt not keen]  # Recommend follow-up in 6 months IV iron possible Feraheme- labs few days prior.

## 2017-02-11 ENCOUNTER — Other Ambulatory Visit: Payer: Self-pay | Admitting: Family Medicine

## 2017-02-11 DIAGNOSIS — Z1231 Encounter for screening mammogram for malignant neoplasm of breast: Secondary | ICD-10-CM

## 2017-03-18 ENCOUNTER — Telehealth: Payer: Self-pay | Admitting: *Deleted

## 2017-03-18 DIAGNOSIS — D500 Iron deficiency anemia secondary to blood loss (chronic): Secondary | ICD-10-CM

## 2017-03-18 NOTE — Telephone Encounter (Signed)
Per D rB, have the patient come in for CBC and iron studies (IIBC, FERR)  Patient accepted appointment for [facility] clinic Monday at 2:15. Orders entered

## 2017-03-18 NOTE — Telephone Encounter (Signed)
Patient reports she is feeling very fatigued and having memory problems times 2 weeks, this is the same as she felt the last time she needed iron

## 2017-03-21 ENCOUNTER — Inpatient Hospital Stay: Payer: Managed Care, Other (non HMO) | Attending: Internal Medicine

## 2017-03-21 DIAGNOSIS — G4730 Sleep apnea, unspecified: Secondary | ICD-10-CM | POA: Diagnosis not present

## 2017-03-21 DIAGNOSIS — E739 Lactose intolerance, unspecified: Secondary | ICD-10-CM | POA: Diagnosis not present

## 2017-03-21 DIAGNOSIS — D500 Iron deficiency anemia secondary to blood loss (chronic): Secondary | ICD-10-CM

## 2017-03-21 DIAGNOSIS — K588 Other irritable bowel syndrome: Secondary | ICD-10-CM | POA: Insufficient documentation

## 2017-03-21 DIAGNOSIS — Z9884 Bariatric surgery status: Secondary | ICD-10-CM | POA: Diagnosis not present

## 2017-03-21 DIAGNOSIS — G4700 Insomnia, unspecified: Secondary | ICD-10-CM | POA: Insufficient documentation

## 2017-03-21 DIAGNOSIS — Z79899 Other long term (current) drug therapy: Secondary | ICD-10-CM | POA: Diagnosis not present

## 2017-03-21 DIAGNOSIS — D509 Iron deficiency anemia, unspecified: Secondary | ICD-10-CM | POA: Insufficient documentation

## 2017-03-21 DIAGNOSIS — E538 Deficiency of other specified B group vitamins: Secondary | ICD-10-CM | POA: Diagnosis not present

## 2017-03-21 LAB — CBC WITH DIFFERENTIAL/PLATELET
BASOS PCT: 1 %
Basophils Absolute: 0.1 10*3/uL (ref 0–0.1)
EOS ABS: 0.6 10*3/uL (ref 0–0.7)
Eosinophils Relative: 7 %
HCT: 43.8 % (ref 35.0–47.0)
HEMOGLOBIN: 14.5 g/dL (ref 12.0–16.0)
Lymphocytes Relative: 30 %
Lymphs Abs: 2.7 10*3/uL (ref 1.0–3.6)
MCH: 29.2 pg (ref 26.0–34.0)
MCHC: 33.1 g/dL (ref 32.0–36.0)
MCV: 88 fL (ref 80.0–100.0)
MONOS PCT: 9 %
Monocytes Absolute: 0.8 10*3/uL (ref 0.2–0.9)
NEUTROS PCT: 53 %
Neutro Abs: 4.9 10*3/uL (ref 1.4–6.5)
Platelets: 456 10*3/uL — ABNORMAL HIGH (ref 150–440)
RBC: 4.98 MIL/uL (ref 3.80–5.20)
RDW: 14.4 % (ref 11.5–14.5)
WBC: 9.1 10*3/uL (ref 3.6–11.0)

## 2017-03-21 LAB — IRON AND TIBC
IRON: 45 ug/dL (ref 28–170)
SATURATION RATIOS: 13 % (ref 10.4–31.8)
TIBC: 357 ug/dL (ref 250–450)
UIBC: 312 ug/dL

## 2017-03-21 LAB — FERRITIN: Ferritin: 88 ng/mL (ref 11–307)

## 2017-03-25 ENCOUNTER — Telehealth: Payer: Self-pay | Admitting: Internal Medicine

## 2017-03-25 NOTE — Telephone Encounter (Signed)
Please inform patient that her labs are stable/normal; which do not explain her fatigue.  Defer to PCP if fatigue not improved.  No recommendation for IV iron at this time.  Follow-up as planned

## 2017-03-25 NOTE — Telephone Encounter (Signed)
Beth Sparks, Please contact patient

## 2017-03-25 NOTE — Telephone Encounter (Signed)
Left message for patient to call office for results. °

## 2017-03-29 ENCOUNTER — Inpatient Hospital Stay: Admission: RE | Admit: 2017-03-29 | Payer: Managed Care, Other (non HMO) | Source: Ambulatory Visit

## 2017-05-05 ENCOUNTER — Ambulatory Visit
Admission: RE | Admit: 2017-05-05 | Discharge: 2017-05-05 | Disposition: A | Discharge status: Home / Self Care | Payer: Managed Care, Other (non HMO) | Source: Ambulatory Visit | Attending: Family Medicine | Admitting: Family Medicine

## 2017-05-05 DIAGNOSIS — Z1231 Encounter for screening mammogram for malignant neoplasm of breast: Secondary | ICD-10-CM

## 2017-07-05 ENCOUNTER — Inpatient Hospital Stay: Payer: Managed Care, Other (non HMO) | Attending: Internal Medicine

## 2017-07-05 DIAGNOSIS — G4730 Sleep apnea, unspecified: Secondary | ICD-10-CM | POA: Diagnosis not present

## 2017-07-05 DIAGNOSIS — D500 Iron deficiency anemia secondary to blood loss (chronic): Secondary | ICD-10-CM

## 2017-07-05 DIAGNOSIS — E538 Deficiency of other specified B group vitamins: Secondary | ICD-10-CM | POA: Diagnosis not present

## 2017-07-05 DIAGNOSIS — D509 Iron deficiency anemia, unspecified: Secondary | ICD-10-CM | POA: Insufficient documentation

## 2017-07-05 DIAGNOSIS — G4700 Insomnia, unspecified: Secondary | ICD-10-CM | POA: Diagnosis not present

## 2017-07-05 DIAGNOSIS — K588 Other irritable bowel syndrome: Secondary | ICD-10-CM | POA: Insufficient documentation

## 2017-07-05 DIAGNOSIS — Z79899 Other long term (current) drug therapy: Secondary | ICD-10-CM | POA: Insufficient documentation

## 2017-07-05 DIAGNOSIS — Z9884 Bariatric surgery status: Secondary | ICD-10-CM | POA: Insufficient documentation

## 2017-07-05 DIAGNOSIS — E739 Lactose intolerance, unspecified: Secondary | ICD-10-CM | POA: Diagnosis not present

## 2017-07-05 LAB — COMPREHENSIVE METABOLIC PANEL
ALBUMIN: 3.6 g/dL (ref 3.5–5.0)
ALT: 38 U/L (ref 14–54)
AST: 41 U/L (ref 15–41)
Alkaline Phosphatase: 93 U/L (ref 38–126)
Anion gap: 9 (ref 5–15)
BUN: 13 mg/dL (ref 6–20)
CHLORIDE: 103 mmol/L (ref 101–111)
CO2: 26 mmol/L (ref 22–32)
CREATININE: 0.58 mg/dL (ref 0.44–1.00)
Calcium: 8.6 mg/dL — ABNORMAL LOW (ref 8.9–10.3)
GFR calc Af Amer: >60 mL/min (ref >60)
GFR calc non Af Amer: >60 mL/min (ref >60)
Glucose, Bld: 111 mg/dL — ABNORMAL HIGH (ref 65–99)
Potassium: 4.1 mmol/L (ref 3.5–5.1)
SODIUM: 138 mmol/L (ref 135–145)
Total Bilirubin: 0.5 mg/dL (ref 0.3–1.2)
Total Protein: 6.8 g/dL (ref 6.5–8.1)

## 2017-07-05 LAB — CBC WITH DIFFERENTIAL/PLATELET
Basophils Absolute: 0.1 10*3/uL (ref 0–0.1)
Basophils Relative: 1 %
EOS ABS: 0.4 10*3/uL (ref 0–0.7)
EOS PCT: 6 %
HCT: 41.2 % (ref 35.0–47.0)
Hemoglobin: 13.8 g/dL (ref 12.0–16.0)
LYMPHS ABS: 1.7 10*3/uL (ref 1.0–3.6)
Lymphocytes Relative: 27 %
MCH: 29 pg (ref 26.0–34.0)
MCHC: 33.5 g/dL (ref 32.0–36.0)
MCV: 86.6 fL (ref 80.0–100.0)
MONOS PCT: 8 %
Monocytes Absolute: 0.5 10*3/uL (ref 0.2–0.9)
Neutro Abs: 3.8 10*3/uL (ref 1.4–6.5)
Neutrophils Relative %: 58 %
PLATELETS: 390 10*3/uL (ref 150–440)
RBC: 4.76 MIL/uL (ref 3.80–5.20)
RDW: 13.6 % (ref 11.5–14.5)
WBC: 6.5 10*3/uL (ref 3.6–11.0)

## 2017-07-05 LAB — IRON AND TIBC
Iron: 61 ug/dL (ref 28–170)
Saturation Ratios: 17 % (ref 10.4–31.8)
TIBC: 351 ug/dL (ref 250–450)
UIBC: 290 ug/dL

## 2017-07-05 LAB — FERRITIN: FERRITIN: 52 ng/mL (ref 11–307)

## 2017-07-12 ENCOUNTER — Inpatient Hospital Stay: Payer: Managed Care, Other (non HMO) | Admitting: Internal Medicine

## 2017-07-12 ENCOUNTER — Inpatient Hospital Stay: Payer: Managed Care, Other (non HMO)

## 2017-07-12 ENCOUNTER — Ambulatory Visit: Payer: Managed Care, Other (non HMO) | Admitting: Internal Medicine

## 2017-07-12 NOTE — Progress Notes (Deleted)
[place] CONSULT NOTE  Patient Care Team: Lynnell Jude, MD as PCP - General (Family Medicine) Clemmie Krill Lynnell Jude, MD (Family Medicine)  CHIEF COMPLAINTS/PURPOSE OF CONSULTATION:   # IRON DEFICIENCY ANEMIA sec to malabsorption/ gastric by pass] s/p IV ferrehem. [place]- march 2018- small polyp [Dr.Anna]  # Gastric Bypass [2007; [facility]];  IM B12 injections on a monthly basis.  HISTORY OF PRESENTING ILLNESS:  Beth Sparks 52 y.o.  female female patient with prior history of gastric bypass; and iron deficiency anemia is status post IV iron [fall 2017]- is here for follow-up.  No blood in stools or black stools. Energy levels are adequate. Not iron pills.  ROS: A complete 10 point review of system is done which is negative except mentioned above in history of present illness  MEDICAL HISTORY:  Past Medical History:  Diagnosis Date  . A-fib ([place]) 2008   after gastric bypass,  resolved  . B12 deficiency anemia   . Cough   . Depression   . Family history of adverse reaction to anesthesia    Mother - PONV  . Foot pain   . H/O cystitis   . H/O seasonal allergies   . H/O sinusitis   . IBS (irritable bowel syndrome)   . Insomnia   . Lactose intolerance   . Malaise and fatigue   . Menopausal disorder   . Migraines    1 week, monthly  . PONV (postoperative nausea and vomiting)   . Renal stone   . Sleep apnea    no cpap  . Wears contact lenses     SURGICAL HISTORY: Past Surgical History:  Procedure Laterality Date  . ABDOMINAL HYSTERECTOMY    . APPENDECTOMY    . BILATERAL CARPAL TUNNEL RELEASE    . COLONOSCOPY WITH PROPOFOL N/A 06/24/2016   Procedure: COLONOSCOPY WITH PROPOFOL;  Surgeon: Jonathon Bellows, MD;  Location: [facility] ENDOSCOPY;  Service: Endoscopy;  Laterality: N/A;  . EXPLORATORY LAPAROTOMY    . GASTRIC BYPASS    . HAND SURGERY    . PARTIAL HYSTERECTOMY    . TONSILLECTOMY    . 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 Marital status: Married    Spouse name: Not on file  . Number of children: Not on file  . Years of education: Not on file  . Highest education level: Not on file  Occupational History  . Not on file  Social Needs  . Financial resource strain: Not on file  . Food insecurity:    Worry: Not on file    Inability: Not on file  . Transportation needs:    Medical: Not on file    Non-medical: Not on file  Tobacco Use  . Smoking status: Never Smoker  . Smokeless tobacco: Never Used  Substance and Sexual Activity  . Alcohol use: Yes    Alcohol/week: 2.4 oz    Types: 4 Standard drinks or equivalent per week  . Drug use: No  . Sexual activity: Yes  Lifestyle  . Physical activity:    Days per week: Not on file    Minutes per session: Not on file  . Stress: Not on file  Relationships  . Social connections:    Talks on phone: Not on file    Gets together: Not on file    Attends religious service: Not on file    Active  member of club or organization: Not on file    Attends meetings of clubs or organizations: Not on file    Relationship status: Not on file  . Intimate partner violence:    Fear of current or ex partner: Not on file    Emotionally abused: Not on file    Physically abused: Not on file    Forced sexual activity: Not on file  Other Topics Concern  . Not on file  Social History Narrative  . Not on file    FAMILY HISTORY:No family history of any colon malignancies. Family History  Problem Relation Age of Onset  . Heart failure Father   . Prostate cancer Maternal Grandfather 65  . Breast cancer Neg Hx     ALLERGIES:  is allergic to sulfa antibiotics; tape; codeine sulfate; lac bovis; quinolones; and sulfur.  MEDICATIONS:  Current Outpatient Medications  Medication Sig Dispense Refill  . B Complex Vitamins (VITAMIN B COMPLEX PO) Take by mouth daily.    Marland Kitchen BIOTIN PO Take by mouth.    .  estradiol (VIVELLE-DOT) 0.1 MG/24HR patch Place 1 patch onto the skin once a week.    Marland Kitchen FOLIC ACID PO Take 1 mg by mouth daily.     Marland Kitchen MAGNESIUM PO Take by mouth daily.    . Multiple Vitamin (MULTIVITAMIN) tablet Take 1 tablet by mouth daily.    . SUMAtriptan (IMITREX) 100 MG tablet Take 1 tablet by mouth daily as needed. migarines    . Venlafaxine HCl (EFFEXOR PO) Take 300 mg by mouth daily.      No current facility-administered medications for this visit.       Marland Kitchen  PHYSICAL EXAMINATION: ECOG PERFORMANCE STATUS: 0  There were no vitals filed for this visit. There were no vitals filed for this visit.  GENERAL: Well-nourished well-developed; Alert, no distress and comfortable.   Obese.  EYES: no pallor or icterus OROPHARYNX: no thrush or ulceration; good dentition  NECK: supple, no masses felt LYMPH:  no palpable lymphadenopathy in the cervical, axillary or inguinal regions LUNGS: clear to auscultation and  No wheeze or crackles HEART/CVS: regular rate & rhythm and no murmurs; No lower extremity edema ABDOMEN: abdomen soft, non-tender and normal bowel sounds Musculoskeletal:no cyanosis of digits and no clubbing  PSYCH: alert & oriented x 3 with fluent speech NEURO: no focal motor/sensory deficits SKIN:  no rashes or significant lesions  LABORATORY DATA:  Results for Beth, Sparks (MRN [id number]) as of 01/11/2017 22:52  Ref. Range 03/25/2016 12:17 06/24/2016 09:02 06/24/2016 09:12 06/29/2016 10:51 01/06/2017 11:30  Iron Latest Ref Range: 28 - 170 ug/dL    51 54  UIBC Latest Units: ug/dL    232 285  TIBC Latest Ref Range: 250 - 450 ug/dL    283 339  Saturation Ratios Latest Ref Range: 10.4 - 31.8 %    18 16  Ferritin Latest Ref Range: 11 - 307 ng/mL    121 64    I have reviewed the data as listed Lab Results  Component Value Date   WBC 6.5 07/05/2017   HGB 13.8 07/05/2017   HCT 41.2 07/05/2017   MCV 86.6 07/05/2017   PLT 390 07/05/2017   Recent Labs    07/05/17 1106  NA 138   K 4.1  CL 103  CO2 26  GLUCOSE 111*  BUN 13  CREATININE 0.58  CALCIUM 8.6*  GFRNONAA >60  GFRAA >60  PROT 6.8  ALBUMIN 3.6  AST 41  ALT 38  ALKPHOS 93  BILITOT 0.5    RADIOGRAPHIC STUDIES: I have personally reviewed the radiological images as listed and agreed with the findings in the report. No results found.  ASSESSMENT & PLAN:   No problem-specific Assessment & Plan notes found for this encounter.    Cammie Sickle, MD 07/12/2017 8:44 AM

## 2017-07-12 NOTE — Assessment & Plan Note (Deleted)
#   Iron deficient anemia- symptomatic from malabsorption/history of gastric bypass/ ? Blood loss. By mouth intolerance.  # Today hemoglobin is 14. Saturation 16% ferritin 64%. Patient feeling well. Would not recommend IV iron. [Pt not keen]  # Recommend follow-up in 6 months IV iron possible Feraheme- labs few days prior.

## 2018-06-15 ENCOUNTER — Other Ambulatory Visit: Payer: Self-pay | Admitting: Family Medicine

## 2018-06-15 DIAGNOSIS — Z1231 Encounter for screening mammogram for malignant neoplasm of breast: Secondary | ICD-10-CM

## 2018-06-27 ENCOUNTER — Ambulatory Visit
Admission: RE | Admit: 2018-06-27 | Discharge: 2018-06-27 | Disposition: A | Discharge status: Home / Self Care | Payer: Managed Care, Other (non HMO) | Source: Ambulatory Visit | Attending: Family Medicine | Admitting: Family Medicine

## 2018-06-27 DIAGNOSIS — Z1231 Encounter for screening mammogram for malignant neoplasm of breast: Secondary | ICD-10-CM | POA: Insufficient documentation

## 2019-03-15 IMAGING — MG DIGITAL SCREENING BILATERAL MAMMOGRAM WITH TOMO AND CAD
6 of 12 series · 6 of 36 positions shown · non-contrast
Comparison: Previous exam(s).

CLINICAL DATA: Screening.

EXAM:
DIGITAL SCREENING BILATERAL MAMMOGRAM WITH TOMO AND CAD

[L MLO synth-2D]
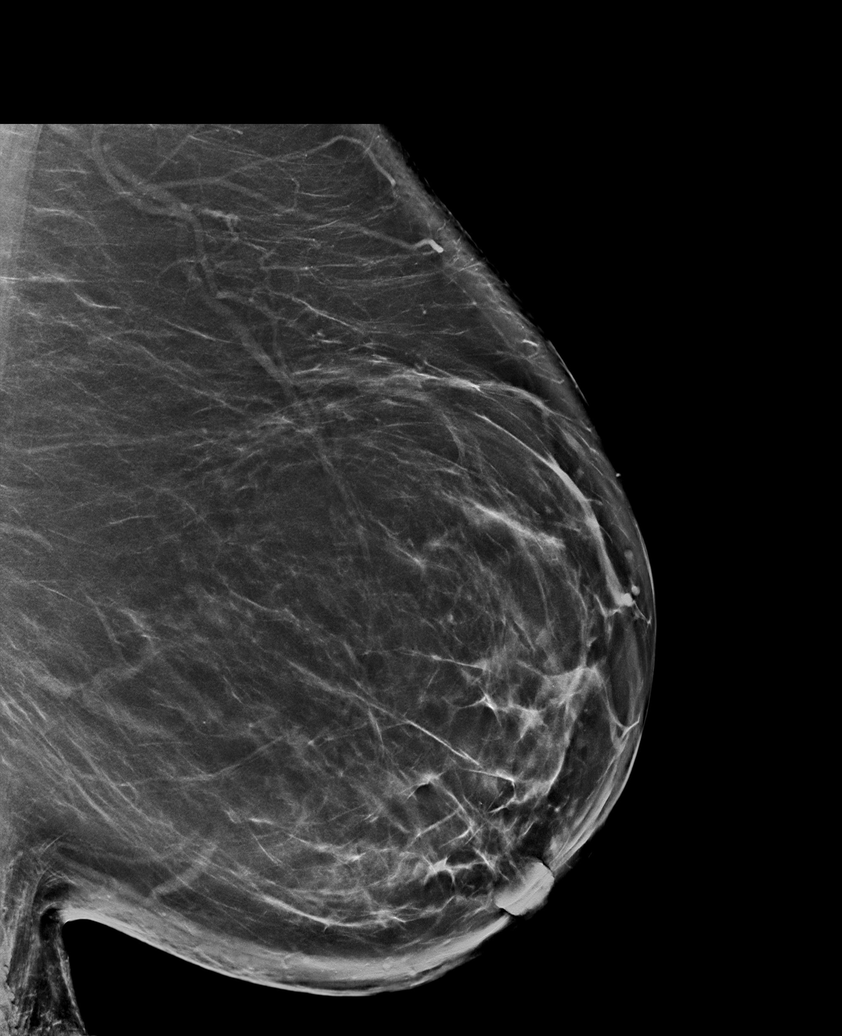

[R CC synth-2D (1 of 2)]
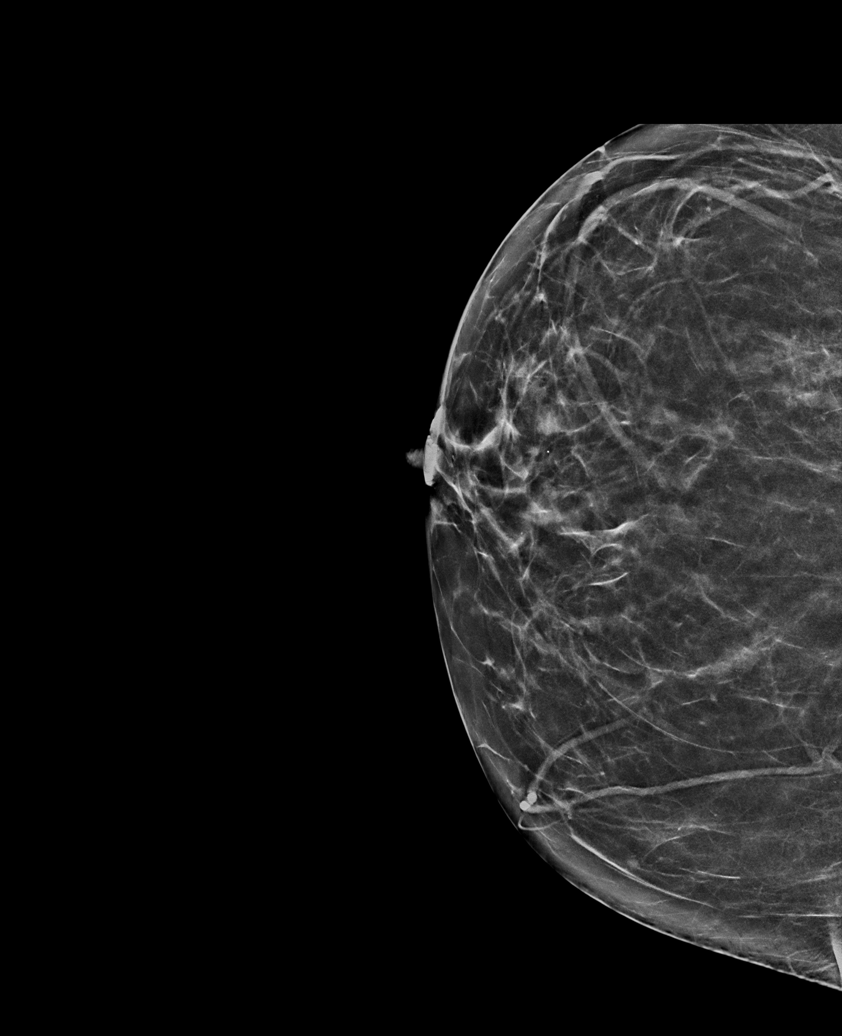

[L CC synth-2D (1 of 2)]
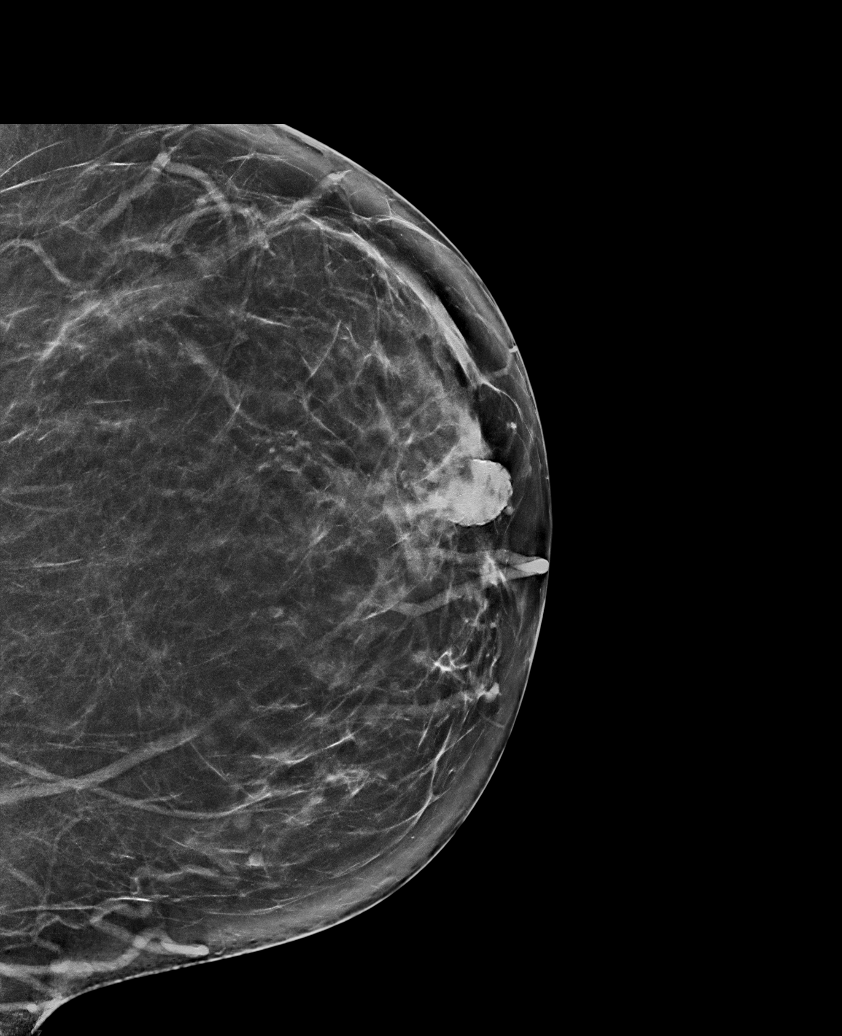

[R CC synth-2D (2 of 2)]
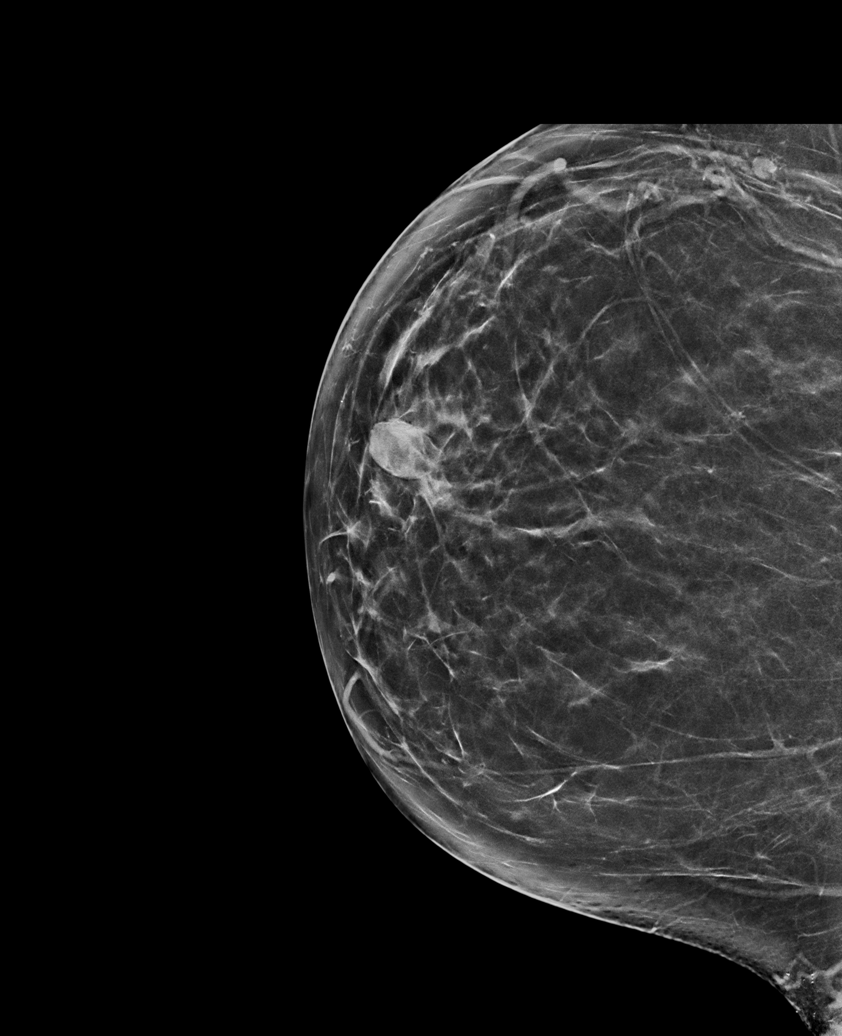

[R MLO synth-2D]
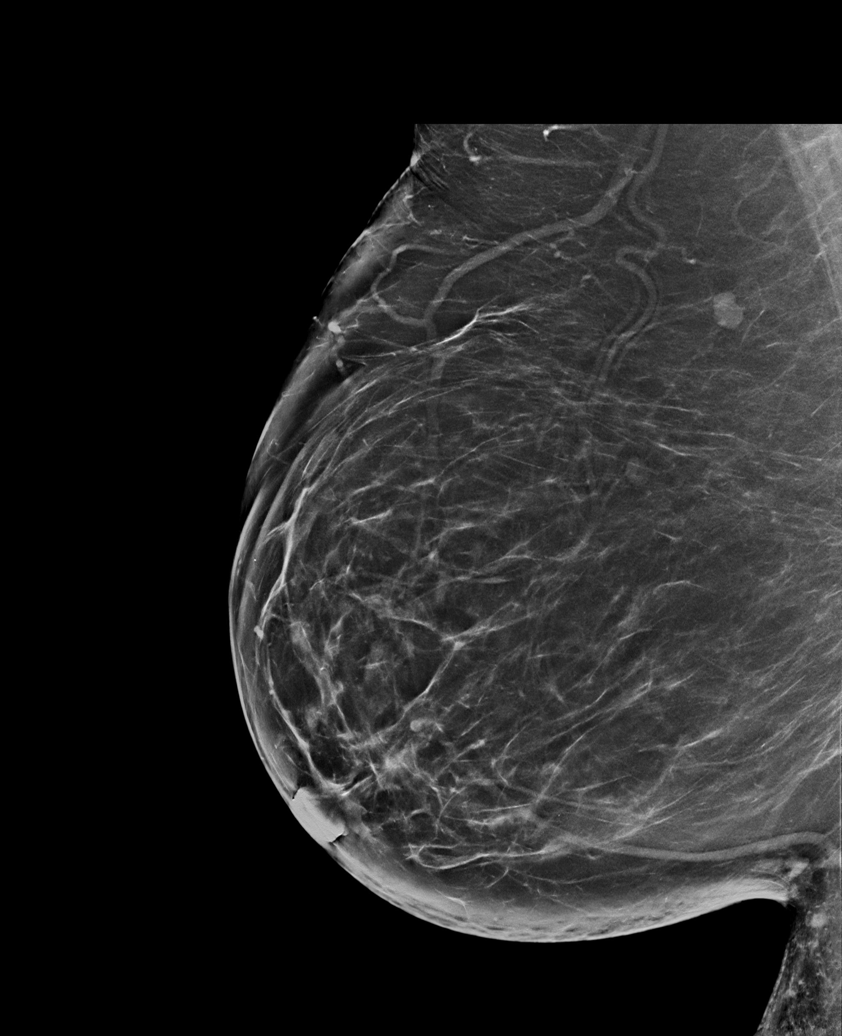

[L CC synth-2D (2 of 2)]
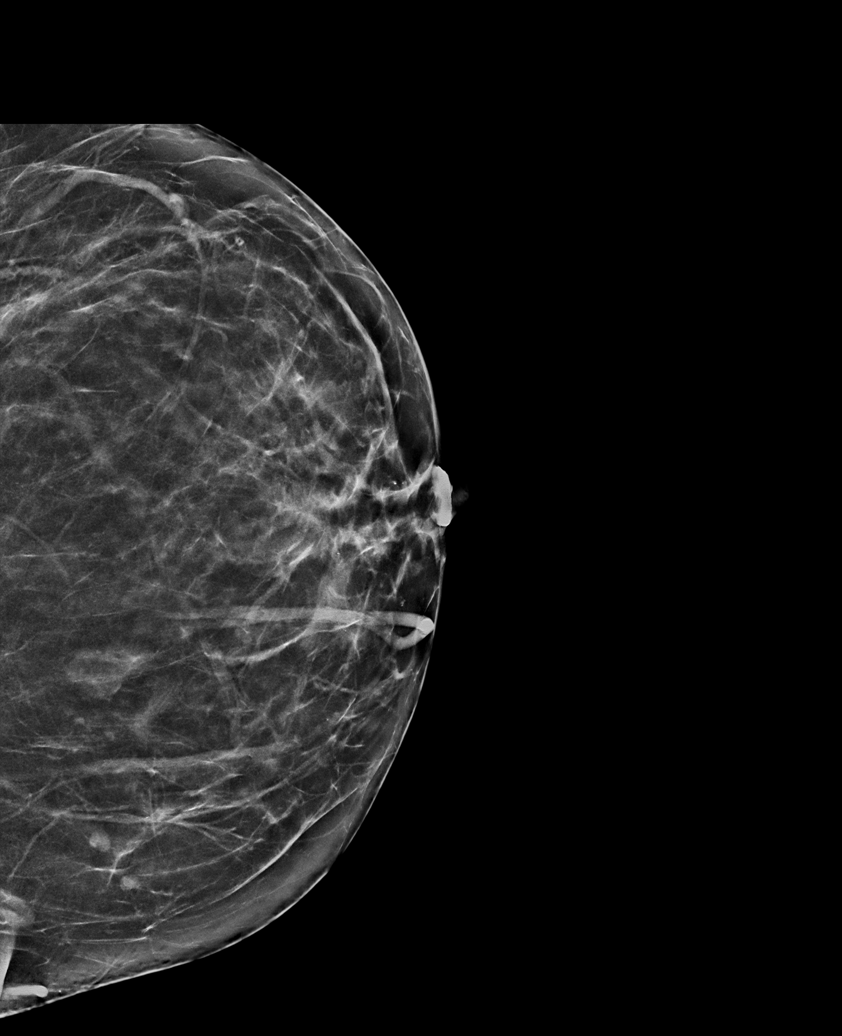

[6 of 36 positions shown; findings below may reference images not displayed]

ACR Breast Density Category b: There are scattered areas of
fibroglandular density.
FINDINGS: There are no findings suspicious for malignancy. Images were
processed with CAD.
IMPRESSION: No mammographic evidence of malignancy. A result letter of this
screening mammogram will be mailed directly to the patient.

RECOMMENDATION:
Screening mammogram in one year. (Code:[id number]-775)

BI-RADS CATEGORY  1: Negative.

## 2019-05-14 ENCOUNTER — Other Ambulatory Visit: Payer: Self-pay | Admitting: Family Medicine

## 2019-05-14 DIAGNOSIS — Z1231 Encounter for screening mammogram for malignant neoplasm of breast: Secondary | ICD-10-CM

## 2019-06-28 ENCOUNTER — Ambulatory Visit
Admission: RE | Admit: 2019-06-28 | Discharge: 2019-06-28 | Disposition: A | Discharge status: Home / Self Care | Payer: Managed Care, Other (non HMO) | Source: Ambulatory Visit | Attending: Family Medicine | Admitting: Family Medicine

## 2019-06-28 ENCOUNTER — Other Ambulatory Visit: Payer: Self-pay

## 2019-06-28 DIAGNOSIS — Z1231 Encounter for screening mammogram for malignant neoplasm of breast: Secondary | ICD-10-CM | POA: Insufficient documentation

## 2019-07-15 ENCOUNTER — Ambulatory Visit: Payer: Managed Care, Other (non HMO) | Attending: Internal Medicine

## 2019-07-15 DIAGNOSIS — Z23 Encounter for immunization: Secondary | ICD-10-CM

## 2019-07-15 NOTE — Progress Notes (Signed)
   Covid-19 Vaccination Clinic  Name:  Beth Sparks    MRN: [id number] DOB: [birth year]  07/15/2019  Beth Sparks was observed post Covid-19 immunization for 15 minutes without incident. She was provided with Vaccine Information Sheet and instruction to access the V-Safe system.   Beth Sparks was instructed to call 911 with any severe reactions post vaccine: Marland Kitchen Difficulty breathing  . Swelling of face and throat  . A fast heartbeat  . A bad rash all over body  . Dizziness and weakness   Immunizations Administered    Name Date Dose VIS Date Route   Pfizer COVID-19 Vaccine 07/15/2019  2:58 PM 0.3 mL 03/30/2019 Intramuscular   Manufacturer: [place]   Lot: [id number]   [place]: [id number]

## 2019-08-08 ENCOUNTER — Ambulatory Visit: Payer: Managed Care, Other (non HMO) | Attending: Internal Medicine

## 2019-08-08 DIAGNOSIS — Z23 Encounter for immunization: Secondary | ICD-10-CM

## 2019-08-08 NOTE — Progress Notes (Signed)
   Covid-19 Vaccination Clinic  Name:  Beth Sparks    MRN: [id number] DOB: 1965/09/10  08/08/2019  [name] was observed post Covid-19 immunization for 15 minutes without incident. She was provided with Vaccine Information Sheet and instruction to access the V-Safe system.   [name] was instructed to call 911 with any severe reactions post vaccine: Marland Kitchen Difficulty breathing  . Swelling of face and throat  . A fast heartbeat  . A bad rash all over body  . Dizziness and weakness   Immunizations Administered    Name Date Dose VIS Date Route   Pfizer COVID-19 Vaccine 08/08/2019  2:50 PM 0.3 mL 06/13/2018 Intramuscular   Manufacturer: [employer]   Lot: [id number]   [place]: [id number]

## 2020-06-17 ENCOUNTER — Other Ambulatory Visit: Payer: Self-pay | Admitting: Family Medicine

## 2020-06-17 DIAGNOSIS — Z1231 Encounter for screening mammogram for malignant neoplasm of breast: Secondary | ICD-10-CM

## 2020-06-30 ENCOUNTER — Inpatient Hospital Stay: Admission: RE | Admit: 2020-06-30 | Payer: Managed Care, Other (non HMO) | Source: Ambulatory Visit

## 2020-07-21 ENCOUNTER — Other Ambulatory Visit: Payer: Self-pay

## 2020-07-21 ENCOUNTER — Ambulatory Visit
Admission: RE | Admit: 2020-07-21 | Discharge: 2020-07-21 | Disposition: A | Discharge status: Home / Self Care | Payer: Managed Care, Other (non HMO) | Source: Ambulatory Visit | Attending: Family Medicine | Admitting: Family Medicine

## 2020-07-21 DIAGNOSIS — Z1231 Encounter for screening mammogram for malignant neoplasm of breast: Secondary | ICD-10-CM | POA: Diagnosis present

## 2020-10-19 ENCOUNTER — Ambulatory Visit
Admission: EM | Admit: 2020-10-19 | Discharge: 2020-10-19 | Disposition: A | Discharge status: Home / Self Care | Payer: Managed Care, Other (non HMO)

## 2020-10-19 ENCOUNTER — Encounter: Payer: Self-pay | Admitting: Gynecology

## 2020-10-19 ENCOUNTER — Other Ambulatory Visit: Payer: Self-pay

## 2020-10-19 DIAGNOSIS — J069 Acute upper respiratory infection, unspecified: Secondary | ICD-10-CM | POA: Diagnosis not present

## 2020-10-19 DIAGNOSIS — H66003 Acute suppurative otitis media without spontaneous rupture of ear drum, bilateral: Secondary | ICD-10-CM

## 2020-10-19 MED ORDER — IPRATROPIUM BROMIDE 0.06 % NA SOLN: 2.0000 | Freq: Four times a day (QID) | NASAL | 12 refills | Status: AC

## 2020-10-19 MED ORDER — BENZONATATE 100 MG PO CAPS: 200.0000 mg | ORAL_CAPSULE | Freq: Three times a day (TID) | ORAL | 0 refills | Status: AC

## 2020-10-19 MED ORDER — PROMETHAZINE-DM 6.25-15 MG/5ML PO SYRP: 5.0000 mL | ORAL_SOLUTION | Freq: Four times a day (QID) | ORAL | 0 refills | Status: AC | PRN

## 2020-10-19 MED ORDER — AEROCHAMBER MV MISC: 2 refills | Status: AC

## 2020-10-19 MED ORDER — AMOXICILLIN-POT CLAVULANATE 875-125 MG PO TABS: 1.0000 | ORAL_TABLET | Freq: Two times a day (BID) | ORAL | 0 refills | Status: AC

## 2020-10-19 NOTE — ED Provider Notes (Signed)
[facility]    CSN: [id number] Arrival date & time: 10/19/20  0815      History   Chief Complaint Chief Complaint  Patient presents with   Cough    Nasal congestion     HPI Beth Sparks is a 55 y.o. female.   HPI  89 old female here for evaluation of respiratory complaints and cough.  Patient reports that she developed nasal congestion with green nasal discharge, productive cough for green sputum, fever up to 100, pressure in her cheeks and upper teeth, swollen lymph nodes, wheezing, and body aches 3 days ago.  She denies shortness of breath or GI complaints.  She states that where she works there is another employee that his coughing a lot but she is unaware of what the cause of her cough is.  She did not home COVID test that was negative.  Past Medical History:  Diagnosis Date   A-fib ([place]) 2008   after gastric bypass,  resolved   B12 deficiency anemia    Cough    Depression    Family history of adverse reaction to anesthesia    Mother - PONV   Foot pain    H/O cystitis    H/O seasonal allergies    H/O sinusitis    IBS (irritable bowel syndrome)    Insomnia    Lactose intolerance    Malaise and fatigue    Menopausal disorder    Migraines    1 week, monthly   PONV (postoperative nausea and vomiting)    Renal stone    Sleep apnea    no cpap   Wears contact lenses     Patient Active Problem List   Diagnosis Date Noted   Benign neoplasm of transverse colon    Diverticulosis of large intestine without diverticulitis    Iron deficiency anemia due to chronic blood loss 08/26/2015    Past Surgical History:  Procedure Laterality Date   ABDOMINAL HYSTERECTOMY     APPENDECTOMY     BILATERAL CARPAL TUNNEL RELEASE     COLONOSCOPY WITH PROPOFOL N/A 06/24/2016   Procedure: COLONOSCOPY WITH PROPOFOL;  Surgeon: Jonathon Bellows, MD;  Location: [facility] ENDOSCOPY;  Service: Endoscopy;  Laterality: N/A;   EXPLORATORY LAPAROTOMY     GASTRIC BYPASS     HAND SURGERY      PARTIAL HYSTERECTOMY     TONSILLECTOMY     TONSILLECTOMY      OB History   No obstetric history on file.      Home Medications    Prior to Admission medications   Medication Sig Start Date End Date Taking? Authorizing Provider  amoxicillin-clavulanate (AUGMENTIN) 875-125 MG tablet Take 1 tablet by mouth every 12 (twelve) hours for 10 days. 10/19/20 10/29/20 Yes Margarette Canada, NP  B Complex Vitamins (VITAMIN B COMPLEX PO) Take by mouth daily.   Yes [provider]  benzonatate (TESSALON) 100 MG capsule Take 2 capsules (200 mg total) by mouth every 8 (eight) hours. 10/19/20  Yes Margarette Canada, NP  BIOTIN PO Take by mouth.   Yes [provider]  estradiol (VIVELLE-DOT) 0.1 MG/24HR patch Place 1 patch onto the skin once a week.   Yes [provider]  FOLIC ACID PO Take 1 mg by mouth daily.    Yes [provider]  ipratropium (ATROVENT) 0.06 % nasal spray Place 2 sprays into both nostrils 4 (four) times daily. 10/19/20  Yes Margarette Canada, NP  Multiple Vitamin (MULTIVITAMIN) tablet Take 1  tablet by mouth daily.   Yes [provider]  promethazine-dextromethorphan (PROMETHAZINE-DM) 6.25-15 MG/5ML syrup Take 5 mLs by mouth 4 (four) times daily as needed. 10/19/20  Yes Margarette Canada, NP  Spacer/Aero-Holding Chambers (AEROCHAMBER MV) inhaler Use as instructed 10/19/20  Yes Margarette Canada, NP  SUMAtriptan (IMITREX) 100 MG tablet Take 1 tablet by mouth daily as needed. migarines 10/24/08  Yes [provider]  MAGNESIUM PO Take by mouth daily.    [provider]    Family History Family History  Problem Relation Age of Onset   Heart failure Father    Prostate cancer Maternal Grandfather 2   Breast cancer Maternal Aunt        mat great aunt    Social History Social History   Tobacco Use   Smoking status: Never   Smokeless tobacco: Never  Substance Use Topics   Alcohol use: Yes    Alcohol/week: 4.0 standard drinks    Types: 4 Standard  drinks or equivalent per week   Drug use: No     Allergies   Sulfa antibiotics, Tape, Codeine sulfate, Lac bovis, Quinolones, and Elemental sulfur   Review of Systems Review of Systems  Constitutional:  Positive for fever. Negative for activity change and appetite change.  HENT:  Positive for congestion, ear pain, postnasal drip, rhinorrhea and sinus pressure. Negative for sore throat.   Respiratory:  Positive for cough and wheezing. Negative for shortness of breath.   Gastrointestinal:  Negative for diarrhea, nausea and vomiting.  Musculoskeletal:  Positive for arthralgias and myalgias.  Hematological:  Positive for adenopathy.  Psychiatric/Behavioral: Negative.      Physical Exam Triage Vital Signs ED Triage Vitals  Enc Vitals Group     BP 10/19/20 0900 140/76     Pulse Rate 10/19/20 0859 80     Resp 10/19/20 0859 16     Temp 10/19/20 0859 98.2 F (36.8 C)     Temp Source 10/19/20 0859 Oral     SpO2 10/19/20 0900 97 %     Weight 10/19/20 0901 280 lb (127 kg)     Height 10/19/20 0901 5' (1.524 m)     Head Circumference --      Peak Flow --      Pain Score 10/19/20 0901 1     Pain Loc --      Pain Edu? --      Excl. in [place]? --    No data found.  Updated Vital Signs BP 140/76 (BP Location: Left Arm)   Pulse 80   Temp 98.2 F (36.8 C) (Oral)   Resp 16   Ht 5' (1.524 m)   Wt 280 lb (127 kg)   SpO2 97%   BMI 54.68 kg/m   Visual Acuity Right Eye Distance:   Left Eye Distance:   Bilateral Distance:    Right Eye Near:   Left Eye Near:    Bilateral Near:     Physical Exam Vitals and nursing note reviewed.  Constitutional:      Appearance: Normal appearance. She is obese. She is ill-appearing.  HENT:     Head: Normocephalic and atraumatic.     Right Ear: Ear canal and external ear normal. There is no impacted cerumen.     Left Ear: Ear canal and external ear normal. There is no impacted cerumen.     Nose: Congestion and rhinorrhea present.      Mouth/Throat:     Mouth: Mucous membranes are moist.  Pharynx: Oropharynx is clear. Posterior oropharyngeal erythema present.  Cardiovascular:     Rate and Rhythm: Normal rate and regular rhythm.     Pulses: Normal pulses.     Heart sounds: Normal heart sounds.  Pulmonary:     Effort: Pulmonary effort is normal.     Breath sounds: Wheezing and rhonchi present. No rales.  Musculoskeletal:     Cervical back: Normal range of motion and neck supple.  Lymphadenopathy:     Cervical: No cervical adenopathy.  Skin:    General: Skin is warm and dry.     Capillary Refill: Capillary refill takes less than 2 seconds.     Findings: No rash.  Neurological:     General: No focal deficit present.     Mental Status: She is alert and oriented to person, place, and time.  Psychiatric:        Mood and Affect: Mood normal.        Behavior: Behavior normal.        Thought Content: Thought content normal.        Judgment: Judgment normal.     UC Treatments / Results  Labs (all labs ordered are listed, but only abnormal results are displayed) Labs Reviewed - No data to display  EKG   Radiology No results found.  Procedures Procedures (including critical care time)  Medications Ordered in UC Medications - No data to display  Initial Impression / Assessment and Plan / UC Course  I have reviewed the triage vital signs and the nursing notes.  Pertinent labs & imaging results that were available during my care of the patient were reviewed by me and considered in my medical decision making (see chart for details).  Patient is a very pleasant but ill-appearing 55 year old female here for evaluation of respiratory complaints that is been going on for the past 3 days as outlined in HPI above.  Patient's physical exam reveals bilateral erythematous and injected tympanic membranes with loss of landmarks.  Both external auditory canals are clear.  Nasal mucosa is erythematous and edematous with  purulent nasal discharge.  Patient does have tenderness with percussion of her maxillary sinuses.  Oropharyngeal exam reveals posterior oropharyngeal erythema with a yellow postnasal drip.  No cervical lymphadenopathy appreciated on exam.  Cardiopulmonary exam reveals expiratory wheezes and rhonchi in the right upper lobe posteriorly but the remainder of her lung sounds have scattered wheezes but otherwise clear.  Respiratory effort is normal.  Patient does not have any tachypnea and she is able to speak in full sentences.  Patient's physical exam is consistent with a URI and bilateral otitis media.  Will treat with Augmentin twice daily for 10 days for treatment of the otitis media, Atrovent nasal spray to help with nasal congestion and postnasal drip, Tessalon Perles and Promethazine DM to help with cough and congestion, and I will have patient use her previously prescribed albuterol inhaler as needed for wheezing.  I did prescribe a spacer and explained the use.   Final Clinical Impressions(s) / UC Diagnoses   Final diagnoses:  Non-recurrent acute suppurative otitis media of both ears without spontaneous rupture of tympanic membranes  Upper respiratory tract infection, unspecified type     Discharge Instructions      Use the Atrovent nasal spray, 2 squirts in each nostril every 6 hours, as needed for runny nose and postnasal drip.  Use the Tessalon Perles every 8 hours during the day.  Take them with a small sip of  water.  They may give you some numbness to the base of your tongue or a metallic taste in your mouth, this is normal.  Use the Promethazine DM cough syrup at bedtime for cough and congestion.  It will make you drowsy so do not take it during the day.  Take the Augmentin twice daily for 10 days with food for treatment of your ear infection.  Take an over-the-counter probiotic 1 hour after each dose of antibiotic to prevent diarrhea.  Use over-the-counter Tylenol and ibuprofen as  needed for pain or fever.  Place a hot water bottle, or heating pad, underneath your pillowcase at night to help dilate up your ear and aid in pain relief as well as resolution of the infection.  Return for reevaluation or see your primary care provider for any new or worsening symptoms.      ED Prescriptions     Medication Sig Dispense Auth. Provider   amoxicillin-clavulanate (AUGMENTIN) 875-125 MG tablet Take 1 tablet by mouth every 12 (twelve) hours for 10 days. 20 tablet Margarette Canada, NP   ipratropium (ATROVENT) 0.06 % nasal spray Place 2 sprays into both nostrils 4 (four) times daily. 15 mL Margarette Canada, NP   benzonatate (TESSALON) 100 MG capsule Take 2 capsules (200 mg total) by mouth every 8 (eight) hours. 21 capsule Margarette Canada, NP   promethazine-dextromethorphan (PROMETHAZINE-DM) 6.25-15 MG/5ML syrup Take 5 mLs by mouth 4 (four) times daily as needed. 118 mL Margarette Canada, NP   Spacer/Aero-Holding Josiah Lobo (AEROCHAMBER MV) inhaler Use as instructed 1 each Margarette Canada, NP      PDMP not reviewed this encounter.   Margarette Canada, NP 10/19/20 1024

## 2020-10-19 NOTE — Discharge Instructions (Signed)
Use the Atrovent nasal spray, 2 squirts in each nostril every 6 hours, as needed for runny nose and postnasal drip.  Use the Tessalon Perles every 8 hours during the day.  Take them with a small sip of water.  They may give you some numbness to the base of your tongue or a metallic taste in your mouth, this is normal.  Use the Promethazine DM cough syrup at bedtime for cough and congestion.  It will make you drowsy so do not take it during the day.  Take the Augmentin twice daily for 10 days with food for treatment of your ear infection.  Take an over-the-counter probiotic 1 hour after each dose of antibiotic to prevent diarrhea.  Use over-the-counter Tylenol and ibuprofen as needed for pain or fever.  Place a hot water bottle, or heating pad, underneath your pillowcase at night to help dilate up your ear and aid in pain relief as well as resolution of the infection.  Return for reevaluation or see your primary care provider for any new or worsening symptoms.

## 2020-10-19 NOTE — ED Triage Notes (Signed)
Patient c/o cough , nasal congestion. Greenish mucous, pt c.o sinus infection x 4 days.Per patient had covid test done yesterday and was negative.

## 2021-03-28 ENCOUNTER — Encounter: Payer: Self-pay | Admitting: Family Medicine

## 2021-03-30 ENCOUNTER — Other Ambulatory Visit: Payer: Self-pay

## 2021-03-30 DIAGNOSIS — Z86010 Personal history of colonic polyps: Secondary | ICD-10-CM

## 2021-03-30 MED ORDER — NA SULFATE-K SULFATE-MG SULF 17.5-3.13-1.6 GM/177ML PO SOLN: 1.0000 | Freq: Once | ORAL | 0 refills | Status: AC

## 2021-03-30 NOTE — Progress Notes (Signed)
Gastroenterology Pre-Procedure Review  Request Date: 05/04/2021 Requesting Physician: Dr. Vicente Males  PATIENT REVIEW QUESTIONS: The patient responded to the following health history questions as indicated:  3 year Recall  1. Are you having any GI issues? yes (IBS) 2. Do you have a personal history of Polyps? yes (06/24/2016 polyps removed.) 3. Do you have a family history of Colon Cancer or Polyps? no 4. Diabetes Mellitus? no 5. Joint replacements in the past 12 months?no 6. Major health problems in the past 3 months?no 7. Any artificial heart valves, MVP, or defibrillator?no    MEDICATIONS & ALLERGIES:    Patient reports the following regarding taking any anticoagulation/antiplatelet therapy:   Plavix, Coumadin, Eliquis, Xarelto, Lovenox, Pradaxa, Brilinta, or Effient? no Aspirin? no  Patient confirms/reports the following medications:  Current Outpatient Medications  Medication Sig Dispense Refill   B Complex Vitamins (VITAMIN B COMPLEX PO) Take by mouth daily.     benzonatate (TESSALON) 100 MG capsule Take 2 capsules (200 mg total) by mouth every 8 (eight) hours. 21 capsule 0   BIOTIN PO Take by mouth.     estradiol (VIVELLE-DOT) 0.1 MG/24HR patch Place 1 patch onto the skin once a week.     FOLIC ACID PO Take 1 mg by mouth daily.      ipratropium (ATROVENT) 0.06 % nasal spray Place 2 sprays into both nostrils 4 (four) times daily. 15 mL 12   MAGNESIUM PO Take by mouth daily.     Multiple Vitamin (MULTIVITAMIN) tablet Take 1 tablet by mouth daily.     promethazine-dextromethorphan (PROMETHAZINE-DM) 6.25-15 MG/5ML syrup Take 5 mLs by mouth 4 (four) times daily as needed. 118 mL 0   Spacer/Aero-Holding Chambers (AEROCHAMBER MV) inhaler Use as instructed 1 each 2   SUMAtriptan (IMITREX) 100 MG tablet Take 1 tablet by mouth daily as needed. migarines     No current facility-administered medications for this visit.    Patient confirms/reports the following allergies:  Allergies   Allergen Reactions   Sulfa Antibiotics Other (See Comments)    Other reaction(s): Unknown   Tape Rash   Codeine Sulfate Nausea And Vomiting   Lac Bovis Other (See Comments)    Other reaction(s): Unknown   Quinolones Other (See Comments)    cipro gives migranes   Elemental Sulfur Rash    No orders of the defined types were placed in this encounter.   AUTHORIZATION INFORMATION Primary Insurance: 1D#: Group #:  Secondary Insurance: 1D#: Group #:  SCHEDULE INFORMATION: Date: 05/04/2021 Time: Location: [place]

## 2021-05-04 ENCOUNTER — Ambulatory Visit: Payer: Managed Care, Other (non HMO) | Admitting: Certified Registered Nurse Anesthetist

## 2021-05-04 ENCOUNTER — Encounter
Admission: RE | Disposition: A | Discharge status: Home / Self Care | Payer: Self-pay | Source: Home / Self Care | Attending: Gastroenterology

## 2021-05-04 ENCOUNTER — Ambulatory Visit
Admission: RE | Admit: 2021-05-04 | Discharge: 2021-05-04 | Disposition: A | Discharge status: Home / Self Care | Payer: Managed Care, Other (non HMO) | Attending: Gastroenterology | Admitting: Gastroenterology

## 2021-05-04 ENCOUNTER — Encounter: Payer: Self-pay | Admitting: Gastroenterology

## 2021-05-04 DIAGNOSIS — Z1211 Encounter for screening for malignant neoplasm of colon: Secondary | ICD-10-CM | POA: Insufficient documentation

## 2021-05-04 DIAGNOSIS — D649 Anemia, unspecified: Secondary | ICD-10-CM | POA: Insufficient documentation

## 2021-05-04 DIAGNOSIS — D126 Benign neoplasm of colon, unspecified: Secondary | ICD-10-CM

## 2021-05-04 DIAGNOSIS — D123 Benign neoplasm of transverse colon: Secondary | ICD-10-CM | POA: Insufficient documentation

## 2021-05-04 DIAGNOSIS — Z6841 Body Mass Index (BMI) 40.0 and over, adult: Secondary | ICD-10-CM | POA: Diagnosis not present

## 2021-05-04 DIAGNOSIS — Z86010 Personal history of colonic polyps: Secondary | ICD-10-CM | POA: Insufficient documentation

## 2021-05-04 DIAGNOSIS — K5730 Diverticulosis of large intestine without perforation or abscess without bleeding: Secondary | ICD-10-CM | POA: Diagnosis not present

## 2021-05-04 DIAGNOSIS — D122 Benign neoplasm of ascending colon: Secondary | ICD-10-CM | POA: Diagnosis not present

## 2021-05-04 DIAGNOSIS — Z9884 Bariatric surgery status: Secondary | ICD-10-CM | POA: Insufficient documentation

## 2021-05-04 DIAGNOSIS — G4730 Sleep apnea, unspecified: Secondary | ICD-10-CM | POA: Insufficient documentation

## 2021-05-04 HISTORY — PX: COLONOSCOPY WITH PROPOFOL: SHX5780

## 2021-05-04 SURGERY — COLONOSCOPY WITH PROPOFOL
Anesthesia: General

## 2021-05-04 MED ORDER — SODIUM CHLORIDE 0.9 % IV SOLN
INTRAVENOUS | Status: DC
  Administered 2021-05-04: 20 mL/h via INTRAVENOUS

## 2021-05-04 MED ORDER — LIDOCAINE HCL (PF) 2 % IJ SOLN
INTRAMUSCULAR | Status: AC
  Filled 2021-05-04: qty 5

## 2021-05-04 MED ORDER — PROPOFOL 500 MG/50ML IV EMUL
INTRAVENOUS | Status: AC
  Filled 2021-05-04: qty 50

## 2021-05-04 MED ORDER — PROPOFOL 500 MG/50ML IV EMUL
INTRAVENOUS | Status: DC | PRN
Start: 2021-05-04 — End: 2021-05-04
  Administered 2021-05-04: 150 ug/kg/min via INTRAVENOUS

## 2021-05-04 MED ORDER — DEXMEDETOMIDINE (PRECEDEX) IN NS 20 MCG/5ML (4 MCG/ML) IV SYRINGE
PREFILLED_SYRINGE | INTRAVENOUS | Status: DC | PRN
  Administered 2021-05-04: 4 ug via INTRAVENOUS

## 2021-05-04 MED ORDER — LIDOCAINE HCL (CARDIAC) PF 100 MG/5ML IV SOSY
PREFILLED_SYRINGE | INTRAVENOUS | Status: DC | PRN
  Administered 2021-05-04: 50 mg via INTRAVENOUS

## 2021-05-04 MED ORDER — PROPOFOL 10 MG/ML IV BOLUS
INTRAVENOUS | Status: DC | PRN
  Administered 2021-05-04: 60 mg via INTRAVENOUS
  Administered 2021-05-04: 20 mg via INTRAVENOUS

## 2021-05-04 NOTE — Anesthesia Preprocedure Evaluation (Signed)
Anesthesia Evaluation  Patient identified by MRN, date of birth, ID band Patient awake    Reviewed: Allergy & Precautions, NPO status , Patient's Chart, lab work & pertinent test results, reviewed documented beta blocker date and time   History of Anesthesia Complications (+) PONV, Family history of anesthesia reaction and history of anesthetic complications  Airway Mallampati: III  TM Distance: >3 FB     Dental  (+) Dental Advidsory Given   Pulmonary neg shortness of breath, sleep apnea , neg COPD, neg recent URI,           Cardiovascular (-) hypertension(-) angina(-) Past MI and (-) Cardiac Stents + dysrhythmias (after gastric bypass, resolved and none since) Atrial Fibrillation (-) Valvular Problems/Murmurs     Neuro/Psych  Headaches, neg Seizures PSYCHIATRIC DISORDERS Depression    GI/Hepatic   Endo/Other  neg diabetesMorbid obesity  Renal/GU Renal disease     Musculoskeletal   Abdominal   Peds  Hematology  (+) Blood dyscrasia, anemia ,   Anesthesia Other Findings Past Medical History: 2008: A-fib ([facility])     Comment:  after gastric bypass,  resolved No date: B12 deficiency anemia No date: Cough No date: Depression No date: Family history of adverse reaction to anesthesia     Comment:  Mother - PONV No date: Foot pain No date: H/O cystitis No date: H/O seasonal allergies No date: H/O sinusitis No date: IBS (irritable bowel syndrome) No date: Insomnia No date: Lactose intolerance No date: Malaise and fatigue No date: Menopausal disorder No date: Migraines     Comment:  1 week, monthly No date: PONV (postoperative nausea and vomiting) No date: Renal stone No date: Sleep apnea     Comment:  no cpap No date: Wears contact lenses   Reproductive/Obstetrics                             Anesthesia Physical  Anesthesia Plan  ASA: 3  Anesthesia Plan: General   Post-op Pain  Management:    Induction: Intravenous  PONV Risk Score and Plan: 4 or greater and TIVA and Propofol infusion  Airway Management Planned: Natural Airway and Nasal Cannula  Additional Equipment:   Intra-op Plan:   Post-operative Plan:   Informed Consent: I have reviewed the patients History and Physical, chart, labs and discussed the procedure including the risks, benefits and alternatives for the proposed anesthesia with the patient or authorized representative who has indicated his/her understanding and acceptance.       Plan Discussed with: CRNA  Anesthesia Plan Comments:         Anesthesia Quick Evaluation

## 2021-05-04 NOTE — Transfer of Care (Signed)
Immediate Anesthesia Transfer of Care Note  Patient: Beth Sparks  Procedure(s) Performed: COLONOSCOPY WITH PROPOFOL  Patient Location: Endoscopy Unit  Anesthesia Type:General  Level of Consciousness: drowsy  Airway & Oxygen Therapy: Patient Spontanous Breathing  Post-op Assessment: Report given to RN and Post -op Vital signs reviewed and stable  Post vital signs: Reviewed and stable  Last Vitals:  Vitals Value Taken Time  BP 118/61 05/04/21 1042  Temp 35.6 C 05/04/21 1042  Pulse 82 05/04/21 1044  Resp 13 05/04/21 1044  SpO2 96 % 05/04/21 1044  Vitals shown include unvalidated device data.  Last Pain:  Vitals:   05/04/21 1042  TempSrc: Oral  PainSc: 0-No pain         Complications: No notable events documented.

## 2021-05-04 NOTE — Op Note (Signed)
[facility] Patient Name: Beth Sparks Procedure Date: 05/04/2021 10:11 AM MRN: [id number] Account #: [id number] Date of Birth: [birth year] Admit Type: Outpatient Age: 56 Room: [facility] ENDO ROOM 4 Gender: Female Note Status: Finalized Instrument Name: Park Meo [id number] Procedure:             Colonoscopy Indications:           Surveillance: Personal history of adenomatous polyps                         on last colonoscopy > 3 years ago, Last colonoscopy:                         March 2018 Providers:             Jonathon Bellows MD, MD Referring MD:          Lynnell Jude (Referring MD) Medicines:             Monitored Anesthesia Care Complications:         No immediate complications. Procedure:             Pre-Anesthesia Assessment:                        - Prior to the procedure, a History and Physical was                         performed, and patient medications, allergies and                         sensitivities were reviewed. The patient's tolerance                         of previous anesthesia was reviewed.                        - The risks and benefits of the procedure and the                         sedation options and risks were discussed with the                         patient. All questions were answered and informed                         consent was obtained.                        - ASA Grade Assessment: II - A patient with mild                         systemic disease.                        After obtaining informed consent, the colonoscope was                         passed under direct vision. Throughout the procedure,                         the patient's blood  pressure, pulse, and oxygen                         saturations were monitored continuously. The                         Colonoscope was introduced through the anus and                         advanced to the the cecum, identified by the                         appendiceal  orifice. The colonoscopy was performed                         with ease. The patient tolerated the procedure well.                         The quality of the bowel preparation was excellent. Findings:      The perianal and digital rectal examinations were normal.      Multiple small-mouthed diverticula were found in the sigmoid colon.      Two sessile polyps were found in the ascending colon. The polyps were 4       to 5 mm in size. These polyps were removed with a cold snare. Resection       and retrieval were complete.      Two sessile polyps were found in the sigmoid colon and transverse colon.       The polyps were 5 to 7 mm in size. These polyps were removed with a cold       snare. Resection and retrieval were complete.      The exam was otherwise without abnormality on direct and retroflexion       views. Impression:            - Diverticulosis in the sigmoid colon.                        - Two 4 to 5 mm polyps in the ascending colon, removed                         with a cold snare. Resected and retrieved.                        - Two 5 to 7 mm polyps in the sigmoid colon and in the                         transverse colon, removed with a cold snare. Resected                         and retrieved.                        - The examination was otherwise normal on direct and                         retroflexion views. Recommendation:        - Discharge patient to home (with escort).                        -  Resume previous diet.                        - Continue present medications.                        - Await pathology results.                        - Repeat colonoscopy for surveillance based on                         pathology results. Procedure Code(s):     --- Professional ---                        [phone], Colonoscopy, flexible; with removal of                         tumor(s), polyp(s), or other lesion(s) by snare                         technique Diagnosis Code(s):      --- Professional ---                        Z86.010, Personal history of colonic polyps                        K63.5, Polyp of colon                        K57.30, Diverticulosis of large intestine without                         perforation or abscess without bleeding CPT copyright 2019 American [employer]. All rights reserved. The codes documented in this report are preliminary and upon coder review may  be revised to meet current compliance requirements. Jonathon Bellows, MD Jonathon Bellows MD, MD 05/04/2021 10:41:11 AM This report has been signed electronically. Number of Addenda: 0 Note Initiated On: 05/04/2021 10:11 AM Scope Withdrawal Time: 0 hours 14 minutes 24 seconds  Total Procedure Duration: 0 hours 17 minutes 28 seconds  Estimated Blood Loss:  Estimated blood loss: none.      [facility]

## 2021-05-04 NOTE — H&P (Signed)
Jonathon Bellows, MD [address] [place], [place], [place], [address] [address], [place], [place], [place], [address] Phone: [phone]  Fax: [phone]  Primary Care Physician:  Lynnell Jude, MD   Pre-Procedure History & Physical: HPI:  Beth Sparks is a 56 y.o. female is here for an colonoscopy.   Past Medical History:  Diagnosis Date   A-fib ([place]) 2008   after gastric bypass,  resolved   B12 deficiency anemia    Cough    Depression    Family history of adverse reaction to anesthesia    Mother - PONV   Foot pain    H/O cystitis    H/O seasonal allergies    H/O sinusitis    IBS (irritable bowel syndrome)    Insomnia    Lactose intolerance    Malaise and fatigue    Menopausal disorder    Migraines    1 week, monthly   PONV (postoperative nausea and vomiting)    Renal stone    Sleep apnea    no cpap   Wears contact lenses     Past Surgical History:  Procedure Laterality Date   ABDOMINAL HYSTERECTOMY     APPENDECTOMY     BILATERAL CARPAL TUNNEL RELEASE     COLONOSCOPY WITH PROPOFOL N/A 06/24/2016   Procedure: COLONOSCOPY WITH PROPOFOL;  Surgeon: Jonathon Bellows, MD;  Location: [facility] ENDOSCOPY;  Service: Endoscopy;  Laterality: N/A;   EXPLORATORY LAPAROTOMY     GASTRIC BYPASS     HAND SURGERY     PARTIAL HYSTERECTOMY     TONSILLECTOMY     TONSILLECTOMY      Prior to Admission medications   Medication Sig Start Date End Date Taking? Authorizing Provider  B Complex Vitamins (VITAMIN B COMPLEX PO) Take by mouth daily.   Yes [provider]  benzonatate (TESSALON) 100 MG capsule Take 2 capsules (200 mg total) by mouth every 8 (eight) hours. 10/19/20  Yes Margarette Canada, NP  BIOTIN PO Take by mouth.   Yes [provider]  estradiol (VIVELLE-DOT) 0.1 MG/24HR patch Place 1 patch onto the skin once a week.   Yes [provider]  FOLIC ACID PO Take 1 mg by mouth daily.    Yes [provider]  ipratropium (ATROVENT) 0.06 % nasal  spray Place 2 sprays into both nostrils 4 (four) times daily. 10/19/20  Yes Margarette Canada, NP  MAGNESIUM PO Take by mouth daily.   Yes [provider]  Multiple Vitamin (MULTIVITAMIN) tablet Take 1 tablet by mouth daily.   Yes [provider]  promethazine-dextromethorphan (PROMETHAZINE-DM) 6.25-15 MG/5ML syrup Take 5 mLs by mouth 4 (four) times daily as needed. 10/19/20  Yes Margarette Canada, NP  Spacer/Aero-Holding Chambers (AEROCHAMBER MV) inhaler Use as instructed 10/19/20  Yes Margarette Canada, NP  SUMAtriptan (IMITREX) 100 MG tablet Take 1 tablet by mouth daily as needed. migarines 10/24/08  Yes [provider]  venlafaxine (EFFEXOR) 100 MG tablet Take 150 mg by mouth 2 (two) times daily.   Yes [provider]    Allergies as of 03/30/2021 - Review Complete 10/19/2020  Allergen Reaction Noted   Sulfa antibiotics Other (See Comments)    Tape Rash 08/26/2015   Codeine sulfate Nausea And Vomiting 09/21/2014   Lac bovis Other (See Comments) 08/26/2015   Quinolones Other (See Comments) 08/26/2015   Elemental sulfur Rash 09/21/2014    Family History  Problem Relation Age of Onset   Heart failure Father  Prostate cancer Maternal Grandfather 87   Breast cancer Maternal Aunt        mat great aunt    Social History   Socioeconomic History   Marital status: Married    Spouse name: Not on file   Number of children: Not on file   Years of education: Not on file   Highest education level: Not on file  Occupational History   Not on file  Tobacco Use   Smoking status: Never   Smokeless tobacco: Never  Substance and Sexual Activity   Alcohol use: Yes    Alcohol/week: 4.0 standard drinks    Types: 4 Standard drinks or equivalent per week   Drug use: No   Sexual activity: Yes  Other Topics Concern   Not on file  Social History Narrative   Not on file   Social Determinants of Health   Financial Resource Strain: Not on file  Food Insecurity: Not on file   Transportation Needs: Not on file  Physical Activity: Not on file  Stress: Not on file  Social Connections: Not on file  Intimate Partner Violence: Not on file    Review of Systems: See HPI, otherwise negative ROS  Physical Exam: BP (!) 145/91    Pulse 80    Temp (!) 96.8 F (36 C) (Temporal)    Resp 20    Ht 5' (1.524 m)    Wt 127 kg    SpO2 99%    BMI 54.68 kg/m  General:   Alert,  pleasant and cooperative in NAD Head:  Normocephalic and atraumatic. Neck:  Supple; no masses or thyromegaly. Lungs:  Clear throughout to auscultation, normal respiratory effort.    Heart:  +S1, +S2, Regular rate and rhythm, No edema. Abdomen:  Soft, nontender and nondistended. Normal bowel sounds, without guarding, and without rebound.   Neurologic:  Alert and  oriented x4;  grossly normal neurologically.  Impression/Plan: Beth Sparks is here for an colonoscopy to be performed for surveillance due to prior history of colon polyps   Risks, benefits, limitations, and alternatives regarding  colonoscopy have been reviewed with the patient.  Questions have been answered.  All parties agreeable.   Jonathon Bellows, MD  05/04/2021, 10:13 AM

## 2021-05-05 LAB — SURGICAL PATHOLOGY

## 2021-05-05 NOTE — Anesthesia Postprocedure Evaluation (Signed)
Anesthesia Post Note  Patient: Beth Sparks  Procedure(s) Performed: COLONOSCOPY WITH PROPOFOL  Patient location during evaluation: Endoscopy Anesthesia Type: General Level of consciousness: awake and alert Pain management: pain level controlled Vital Signs Assessment: post-procedure vital signs reviewed and stable Respiratory status: spontaneous breathing, nonlabored ventilation, respiratory function stable and patient connected to nasal cannula oxygen Cardiovascular status: blood pressure returned to baseline and stable Postop Assessment: no apparent nausea or vomiting Anesthetic complications: no   No notable events documented.   Last Vitals:  Vitals:   05/04/21 1042 05/04/21 1102  BP:  125/78  Pulse:    Resp:    Temp: (!) 35.6 C   SpO2:      Last Pain:  Vitals:   05/04/21 1112  TempSrc:   PainSc: 0-No pain                 Martha Clan

## 2021-05-06 ENCOUNTER — Encounter: Payer: Self-pay | Admitting: Gastroenterology

## 2021-08-14 ENCOUNTER — Other Ambulatory Visit: Payer: Self-pay | Admitting: Family Medicine

## 2021-08-14 DIAGNOSIS — Z1231 Encounter for screening mammogram for malignant neoplasm of breast: Secondary | ICD-10-CM

## 2021-09-29 ENCOUNTER — Ambulatory Visit
Admission: RE | Admit: 2021-09-29 | Discharge: 2021-09-29 | Disposition: A | Discharge status: Home / Self Care | Payer: Managed Care, Other (non HMO) | Source: Ambulatory Visit | Attending: Family Medicine | Admitting: Family Medicine

## 2021-09-29 DIAGNOSIS — Z1231 Encounter for screening mammogram for malignant neoplasm of breast: Secondary | ICD-10-CM | POA: Diagnosis present

## 2022-10-22 ENCOUNTER — Other Ambulatory Visit: Payer: Self-pay | Admitting: Family Medicine

## 2022-10-22 DIAGNOSIS — Z1231 Encounter for screening mammogram for malignant neoplasm of breast: Secondary | ICD-10-CM

## 2022-10-28 ENCOUNTER — Ambulatory Visit
Admission: RE | Admit: 2022-10-28 | Discharge: 2022-10-28 | Disposition: A | Discharge status: Home / Self Care | Payer: Managed Care, Other (non HMO) | Source: Ambulatory Visit | Attending: Family Medicine | Admitting: Family Medicine

## 2022-10-28 DIAGNOSIS — Z1231 Encounter for screening mammogram for malignant neoplasm of breast: Secondary | ICD-10-CM | POA: Diagnosis present

## 2023-12-20 ENCOUNTER — Other Ambulatory Visit: Payer: Self-pay | Admitting: Family Medicine

## 2023-12-20 DIAGNOSIS — Z1231 Encounter for screening mammogram for malignant neoplasm of breast: Secondary | ICD-10-CM

## 2024-01-19 ENCOUNTER — Ambulatory Visit

## 2024-01-19 ENCOUNTER — Ambulatory Visit
Admission: RE | Admit: 2024-01-19 | Discharge: 2024-01-19 | Disposition: A | Discharge status: Home / Self Care | Source: Ambulatory Visit | Attending: Family Medicine | Admitting: Family Medicine

## 2024-01-19 DIAGNOSIS — Z1231 Encounter for screening mammogram for malignant neoplasm of breast: Secondary | ICD-10-CM | POA: Diagnosis present
